# Patient Record
Sex: Female | Born: 1960 | Race: White | Hispanic: No | Smoking: Never smoker
Health system: Southern US, Community
[De-identification: ages and names within clinical notes are randomized; demographics above are authoritative.]

## PROBLEM LIST (undated history)

## (undated) DIAGNOSIS — F32A Depression, unspecified: Secondary | ICD-10-CM

## (undated) DIAGNOSIS — K219 Gastro-esophageal reflux disease without esophagitis: Secondary | ICD-10-CM

## (undated) DIAGNOSIS — E785 Hyperlipidemia, unspecified: Secondary | ICD-10-CM

## (undated) DIAGNOSIS — F329 Major depressive disorder, single episode, unspecified: Secondary | ICD-10-CM

## (undated) HISTORY — PX: LASIK: SHX215

## (undated) HISTORY — PX: TUBAL LIGATION: SHX77

## (undated) HISTORY — DX: Major depressive disorder, single episode, unspecified: F32.9

## (undated) HISTORY — DX: Hyperlipidemia, unspecified: E78.5

## (undated) HISTORY — DX: Depression, unspecified: F32.A

---

## 2003-03-02 HISTORY — PX: AUGMENTATION MAMMAPLASTY: SUR837

## 2004-03-01 HISTORY — PX: STOMACH SURGERY: SHX791

## 2005-03-01 HISTORY — PX: BREAST ENHANCEMENT SURGERY: SHX7

## 2006-06-07 ENCOUNTER — Ambulatory Visit: Payer: Self-pay | Admitting: Gynecologic Oncology

## 2006-06-30 ENCOUNTER — Ambulatory Visit: Payer: Self-pay | Admitting: Gynecologic Oncology

## 2010-09-11 ENCOUNTER — Emergency Department: Payer: Self-pay | Admitting: Emergency Medicine

## 2011-03-02 HISTORY — PX: COLONOSCOPY: SHX174

## 2011-03-02 HISTORY — PX: ENDOMETRIAL ABLATION: SHX621

## 2011-10-06 ENCOUNTER — Encounter: Payer: Self-pay | Admitting: Pulmonary Disease

## 2011-10-07 ENCOUNTER — Ambulatory Visit (INDEPENDENT_AMBULATORY_CARE_PROVIDER_SITE_OTHER): Payer: BC Managed Care – PPO | Admitting: Pulmonary Disease

## 2011-10-07 ENCOUNTER — Encounter: Payer: Self-pay | Admitting: Pulmonary Disease

## 2011-10-07 ENCOUNTER — Ambulatory Visit (INDEPENDENT_AMBULATORY_CARE_PROVIDER_SITE_OTHER)
Admission: RE | Admit: 2011-10-07 | Discharge: 2011-10-07 | Disposition: A | Discharge status: Home / Self Care | Payer: BC Managed Care – PPO | Source: Ambulatory Visit | Attending: Pulmonary Disease | Admitting: Pulmonary Disease

## 2011-10-07 ENCOUNTER — Inpatient Hospital Stay: Payer: Self-pay | Admitting: Internal Medicine

## 2011-10-07 VITALS — BP 112/60 | HR 60 | Temp 97.5°F | Ht 64.0 in | Wt 137.4 lb

## 2011-10-07 DIAGNOSIS — D649 Anemia, unspecified: Secondary | ICD-10-CM

## 2011-10-07 DIAGNOSIS — R0602 Shortness of breath: Secondary | ICD-10-CM

## 2011-10-07 DIAGNOSIS — R0600 Dyspnea, unspecified: Secondary | ICD-10-CM

## 2011-10-07 DIAGNOSIS — R0989 Other specified symptoms and signs involving the circulatory and respiratory systems: Secondary | ICD-10-CM

## 2011-10-07 DIAGNOSIS — R0609 Other forms of dyspnea: Secondary | ICD-10-CM

## 2011-10-07 DIAGNOSIS — D509 Iron deficiency anemia, unspecified: Secondary | ICD-10-CM | POA: Insufficient documentation

## 2011-10-07 DIAGNOSIS — R079 Chest pain, unspecified: Secondary | ICD-10-CM

## 2011-10-07 LAB — COMPREHENSIVE METABOLIC PANEL
Albumin: 4.2 g/dL (ref 3.4–5.0)
Alkaline Phosphatase: 40 U/L — ABNORMAL LOW (ref 50–136)
BUN: 6 mg/dL — ABNORMAL LOW (ref 7–18)
Bilirubin,Total: 0.2 mg/dL (ref 0.2–1.0)
Chloride: 103 mmol/L (ref 98–107)
Co2: 25 mmol/L (ref 21–32)
Creatinine: 0.88 mg/dL (ref 0.60–1.30)
EGFR (Non-African Amer.): >60
Osmolality: 270 (ref 275–301)
Potassium: 3.7 mmol/L (ref 3.5–5.1)
SGPT (ALT): 19 U/L (ref 12–78)
Sodium: 136 mmol/L (ref 136–145)
Total Protein: 7.8 g/dL (ref 6.4–8.2)

## 2011-10-07 LAB — CBC
HCT: 20.6 % — ABNORMAL LOW (ref 35.0–47.0)
HGB: 6.4 g/dL — ABNORMAL LOW (ref 12.0–16.0)
MCH: 18.8 pg — ABNORMAL LOW (ref 26.0–34.0)
MCHC: 30.9 g/dL — ABNORMAL LOW (ref 32.0–36.0)

## 2011-10-07 LAB — TROPONIN I: Troponin-I: <0.02 ng/mL

## 2011-10-07 NOTE — Assessment & Plan Note (Signed)
As noted above Cheryl Goodwin has a microcytic anemia with a hemoglobin of only 6.6. This is concerning because her hemoglobin was 6.6 about a month ago. She has no signs of overt bleeding by history but I think that this is a likely explanation for her shortness of breath.  Plan: -She is due to go to the [facility] emergency room today for a stat repeat CBC and likely admission for transfusion and anemia workup.

## 2011-10-07 NOTE — Patient Instructions (Signed)
We will send you for an exercise stress test with Drs. Gollan and Arida's office We will send you send you for a Chest x-ray at the [facility] office We will request the results of the blood test performed by your primary care physician If all these tests are normal, then you should start exercising regularly and then see [place] back in 3 months

## 2011-10-07 NOTE — Assessment & Plan Note (Addendum)
Today in the office Cheryl Goodwin's simple spirometry and oximetry testing with walking or normal. She also had a normal EKG. These are reassuring that there is no severe underlying lung disease. While spirometry can be normal in the setting of asthma she does not have symptoms are consistent with asthma as she has no cough or wheezing. Her functional status seems to be intact but she is just more short of breath now than she was 2 years ago she was working out on a regular basis. The only abnormalities that noted on exam today is a few crackles in her right lung base and her heart rate climbed a little higher than I would expect for only walking 500 feet.  We obtain records of her labwork performed by her primary care physician's office last month and found that her hemoglobin on 09/10/2011 was 6.6. Unclear to me what the cause of her anemia is but I think that this is likely the etiology of her shortness of breath.  She does have some atypical chest pain which need to be evaluated with an exercise stress test.  Plan: -The patient will go to the [facility] emergency room today for a repeat H&H an admission for anemia workup and transfusion. -She'll have an exercise stress test -After her hospitalization (and if her stress test is normal) I've encouraged her to start exercising on a regular basis. -Followup with me in 3 months

## 2011-10-07 NOTE — Assessment & Plan Note (Signed)
She describes a very atypical chest pain which has a low pretest probability of being due to coronary ischemia. It is reasonable to assess this with an exercise stress test.

## 2011-10-07 NOTE — Progress Notes (Signed)
Subjective:    Patient ID: Cheryl Goodwin, female    DOB: 1960-07-17, 51 y.o.   MRN: [id number]  HPI  This is a very pleasant 51 year old female who comes her office today for evaluation of shortness of breath. She states that she is a nonsmoker but has been around smokers most of her life. For the last year she's has been experiencing increasing shortness of breath on exertion.  She can still be regular activities around the house but gets somewhat short of breath when climbing multiple flights of stairs or running a vacuum cleaner. When she goes to the grocery store she does not feel short of breath walking around. She states that the shortness of breath is worse when she bends over or tries to pick something up. She states that when this occurs she notices that her heart rate goes up. She does not have cough and has not noticed herself wheezing. There are no environmental triggers that will make her suddenly start to feel short of breath.  Since the death of her sister 2 years ago she has been very depressed and she basically has not been exercising at all which is unusual for her. 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  She also notes chest pressure on the right side which typically occurs at rest it is sometimes associated with her shortness of breath. She states that her shortness of breath is also associated with a fast heart rate.  Past Medical History  Diagnosis Date  . Depression   . Hyperlipidemia      Family History  Problem Relation Age of Onset  . Stroke Mother   . Emphysema Father     was a smoker  . COPD Sister     was a smoker     History   Social History  . Marital Status: Married    Spouse Name: N/A    Number of Children: 2  . Years of Education: N/A   Occupational History  . Unemployed    Social History Main Topics  . Smoking status: Never Smoker   . Smokeless tobacco: Never Used  .  Alcohol Use: No  . Drug Use: No  . Sexually Active: Not on file   Other Topics Concern  . Not on file   Social History Narrative  . No narrative on file     No Known Allergies   No outpatient prescriptions prior to visit.     Review of Systems  Constitutional: Negative for fever, chills and unexpected weight change.  HENT: Negative for ear pain, nosebleeds, congestion, sore throat, rhinorrhea, sneezing, trouble swallowing, dental problem, voice change, postnasal drip and sinus pressure.   Eyes: Negative for visual disturbance.  Respiratory: Positive for shortness of breath. Negative for cough and choking.   Cardiovascular: Positive for chest pain. Negative for leg swelling.  Gastrointestinal: Negative for vomiting, abdominal pain and diarrhea.  Genitourinary: Negative for difficulty urinating.  Musculoskeletal: Negative for arthralgias.  Skin: Negative for rash.  Neurological: Positive for headaches. Negative for tremors and syncope.  Hematological: Does not bruise/bleed easily.       Objective:   Physical Exam  Filed Vitals:   10/07/11 1415  BP: 112/60  Pulse: 60  Temp: 97.5 F (36.4 C)  TempSrc: Oral  Height: 5\' 4"  (1.626 m)  Weight: 137 lb 6.4 oz (62.324 kg)  SpO2: 100%   Walked 56ft in office,  95% was lowest O2 saturation, HR to 113  Gen: well appearing, no acute distress HEENT: NCAT, PERRL, EOMi, OP clear, neck supple without masses PULM: Insp crackles R base CV: RRR, no mgr, no JVD AB: BS+, soft, nontender, no hsm Ext: warm, no edema, no clubbing, no cyanosis Derm: no rash or skin breakdown Neuro: A&Ox4, CN II-XII intact, strength 5/5 in all 4 extremities  10/07/2011 simple spirometry normal 10/07/2011 chest x-ray hyperinflation no infiltrate     Assessment & Plan:   Shortness of breath Today in the office Ms. Cheryl Goodwin's simple spirometry and oximetry testing with walking or normal. She also had a normal EKG. These are reassuring that there is no  severe underlying lung disease. While spirometry can be normal in the setting of asthma she does not have symptoms are consistent with asthma as she has no cough or wheezing. Her functional status seems to be intact but she is just more short of breath now than she was 2 years ago she was working out on a regular basis. The only abnormalities that noted on exam today is a few crackles in her right lung base and her heart rate climbed a little higher than I would expect for only walking 500 feet.  We obtain records of her labwork performed by her primary care physician's office last month and found that her hemoglobin on 09/10/2011 was 6.6. Unclear to me what the cause of her anemia is but I think that this is likely the etiology of her shortness of breath.  She does have some atypical chest pain which need to be evaluated with an exercise stress test.  Plan: -The patient will go to the [facility] emergency room today for a repeat H&H an admission for anemia workup and transfusion. -She'll have an exercise stress test -After her hospitalization (and if her stress test is normal) I've encouraged her to start exercising on a regular basis. -Followup with me in 3 months   Chest pain She describes a very atypical chest pain which has a low pretest probability of being due to coronary ischemia. It is reasonable to assess this with an exercise stress test.  Anemia As noted above Ms. Cheryl Goodwin has a microcytic anemia with a hemoglobin of only 6.6. This is concerning because her hemoglobin was 6.6 about a month ago. She has no signs of overt bleeding by history but I think that this is a likely explanation for her shortness of breath.  Plan: -She is due to go to the [facility] emergency room today for a stat repeat CBC and likely admission for transfusion and anemia workup.   Updated Medication List Outpatient Encounter Prescriptions as of 10/07/2011  Medication Sig Dispense Refill  . buPROPion (WELLBUTRIN XL) 300 MG  24 hr tablet Take 300 mg by mouth daily.      Marland Kitchen omeprazole (PRILOSEC) 40 MG capsule Take 40 mg by mouth daily.      . sertraline (ZOLOFT) 100 MG tablet Take 150 mg by mouth daily.      . simvastatin (ZOCOR) 20 MG tablet Take 20 mg by mouth every evening.      . Vitamin D, Ergocalciferol, (DRISDOL) 50000 UNITS CAPS Take 50,000 Units by mouth every 7 (seven) days.

## 2011-10-08 LAB — CBC WITH DIFFERENTIAL/PLATELET
Basophil #: 0.2 10*3/uL — ABNORMAL HIGH (ref 0.0–0.1)
Basophil %: 2.2 %
Eosinophil #: 0.4 10*3/uL (ref 0.0–0.7)
HCT: 23 % — ABNORMAL LOW (ref 35.0–47.0)
HGB: 7.5 g/dL — ABNORMAL LOW (ref 12.0–16.0)
Lymphocyte #: 2.9 10*3/uL (ref 1.0–3.6)
MCH: 20.8 pg — ABNORMAL LOW (ref 26.0–34.0)
MCHC: 32.5 g/dL (ref 32.0–36.0)
Monocyte #: 0.7 x10 3/mm (ref 0.2–0.9)
Neutrophil #: 4.3 10*3/uL (ref 1.4–6.5)
RDW: 23.4 % — ABNORMAL HIGH (ref 11.5–14.5)

## 2011-10-08 LAB — MAGNESIUM: Magnesium: 2 mg/dL

## 2011-10-09 LAB — CBC WITH DIFFERENTIAL/PLATELET
Basophil #: 0.1 10*3/uL (ref 0.0–0.1)
Lymphocyte #: 2.6 10*3/uL (ref 1.0–3.6)
MCH: 21.4 pg — ABNORMAL LOW (ref 26.0–34.0)
MCHC: 32.9 g/dL (ref 32.0–36.0)
MCV: 65 fL — ABNORMAL LOW (ref 80–100)
Monocyte #: 0.7 x10 3/mm (ref 0.2–0.9)
Neutrophil #: 5 10*3/uL (ref 1.4–6.5)
Platelet: 484 10*3/uL — ABNORMAL HIGH (ref 150–440)
RDW: 25.2 % — ABNORMAL HIGH (ref 11.5–14.5)

## 2011-10-14 ENCOUNTER — Encounter: Payer: BC Managed Care – PPO | Admitting: Physician Assistant

## 2011-11-02 ENCOUNTER — Observation Stay: Payer: Self-pay | Admitting: Obstetrics and Gynecology

## 2012-02-15 ENCOUNTER — Ambulatory Visit: Payer: BC Managed Care – PPO | Admitting: Pulmonary Disease

## 2012-02-15 NOTE — Progress Notes (Signed)
No show

## 2014-04-19 DIAGNOSIS — B001 Herpesviral vesicular dermatitis: Secondary | ICD-10-CM | POA: Insufficient documentation

## 2014-04-19 DIAGNOSIS — F419 Anxiety disorder, unspecified: Secondary | ICD-10-CM | POA: Insufficient documentation

## 2014-06-18 NOTE — Discharge Summary (Signed)
PATIENT NAMEDOROTHE, Cheryl Goodwin MR#:  [id number] DATE OF BIRTH:  06-20-1960  DATE OF ADMISSION:  10/07/2011 DATE OF DISCHARGE:  10/09/2011  DISCHARGE DIAGNOSES:  1. Symptomatic anemia of iron deficiency and blood loss due to menorrhagia. 2. Gastroesophageal reflux disease. 3. Hyperlipidemia.   4. Anxiety.   DISPOSITION: The patient is being discharged home.   DIET: Regular.   ACTIVITY: As tolerated.   DISCHARGE MEDICATIONS:  1. Iron polysaccharide 150 mg b.i.d.  2. Vitamin D2 50,000 international units once a week.  3. Bupropion 300 mg one tablet q. 24 hours. 4. Omeprazole 40 mg daily.  5. Simvastatin 20 mg daily.  6. Sertraline 100 mg daily.   FOLLOW-UP:  1. Follow-up with Dr. Ellison Hughs, primary care physician. 2. Follow-up with OB/GYN doctor, Dr. Amalia Hailey, in 1 to 2 weeks after discharge.   RESULTS: Pelvic ultrasound showed no acute abnormalities other than small ovarian cyst.   Chest x-ray no acute abnormalities.   Hemoglobin was 6.4 on admission, 9.4 by the time of discharge. Platelet count 513 to 484. Glucose 111. Rest of CMP was normal. Cardiac enzymes negative.   HOSPITAL COURSE: The patient is a 54 year old female with past medical history of anxiety, depression, hyperlipidemia, and gastroesophageal reflux disease who presented with shortness of breath. She was found to have anemia with hemoglobin of 6.4. She had menorrhagia for almost one and a half years but had not sought evaluation for that. She received two units of blood during the hospitalization and her hemoglobin went up to 9.1. Symptomatically she was much improved. A pelvic ultrasound was done which showed no acute abnormalities. The patient was offered a GYN evaluation during the hospitalization, however, she wanted to follow-up with her OB/GYN doctor, Dr. Amalia Hailey, as an outpatient. She is being discharged home on oral iron and advised to follow-up with Dr. Amalia Hailey, her OB/GYN, within a week of discharge.   TIME SPENT:  45 minutes.   ____________________________ [name], MD sp:drc D: 10/09/2011 14:39:31 ET T: 10/11/2011 11:02:13 ET JOB#: [id number]  cc: [name], MD, <Dictator>  [name] MD ELECTRONICALLY SIGNED 10/11/2011 13:16

## 2014-06-18 NOTE — H&P (Signed)
PATIENT NAME:  Cheryl Goodwin, Cheryl Goodwin MR#:  [id number] DATE OF BIRTH:  [birth year]  DATE OF ADMISSION:  10/07/2011  PRIMARY CARE PHYSICIAN: No. Will have appointment with [facility] nurse practitioner  REFERRING PHYSICIAN: Dr. Reita Cliche  CHIEF COMPLAINT: Shortness of breath, weakness for three months.   HISTORY OF PRESENT ILLNESS: 54 year old Caucasian female with history of anemia, gastroesophageal reflux disease, hyperlipidemia, anxiety presented to the ED with above chief complaint. Patient is alert, awake, oriented in no acute distress. Per patient she has had intermittent shortness of breath, weakness, occasional dizziness for the past three months. 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 She denies any hematuria, melena or bloody stool but has irregular periods. She saw pulmonary physician due to shortness of breath today. Was noted to have hemoglobin of 6 and was sent to the ED for further evaluation. In ED her hemoglobin is 6.4. Stool occult is negative. Dr. Reita Cliche ordered PRBC transfusion and is admitted for anemia.  PAST MEDICAL HISTORY:  1. Anemia.  2. Gastroesophageal reflux disease.  3. Hyperlipidemia.  4. Anxiety.   PAST SURGICAL HISTORY: No.   SOCIAL HISTORY: No smoking, alcohol drinking, or illicit drugs.   FAMILY HISTORY: Chronic obstructive pulmonary disease runs in her family.   MEDICATIONS:  1. Vitamin D2 50,000 international units 1 cap once week. 2. Zocor 20 mg p.o. daily. 3. Sertraline 100 mg p.o. once daily.  4. Omeprazole 40 mg p.o. daily.  5. Bupropion 300 mg p.o. every 24 hours a day.   ALLERGIES: No.   REVIEW OF SYSTEMS: CONSTITUTIONAL: Patient denies any fever, chills. No headache but has occasional dizziness and also has generalized weakness. EYES: No double vision or blurred vision. ENT: No epistaxis, postnasal drip, slurred speech, or dysphagia. CARDIOVASCULAR: Has chest tightness but no chest pain,  palpitations, orthopnea, or nocturnal dyspnea. No leg edema. PULMONARY: Positive for shortness of breath but no cough, wheezing, hemoptysis or sputum. GASTROINTESTINAL: No abdominal pain, nausea, vomiting, or diarrhea. No melena or bloody stool. GENITOURINARY: No dysuria, hematuria, or incontinence. SKIN: No rash or jaundice. ENDOCRINE: No polyuria, polydipsia. HEMATOLOGY: No easy bruising. No bleeding. PSYCH: No anxiety or depression. NEUROLOGY: No syncope, loss of consciousness or seizure.   PHYSICAL EXAMINATION: VITAL SIGNS: Blood pressure 131/76, pulse 82, respirations 18, oxygen saturation 100% on room air.   GENERAL: Patient is alert, awake, oriented in no acute distress.   HEENT: Pupils round, equal, reactive to light and accommodation. Moist oral mucosa. Clear oropharynx.   NECK: Supple. No JVD or carotid bruit. No lymphadenopathy. No thyromegaly.   CARDIOVASCULAR: S1, S2 regular rate, rhythm. No murmurs, gallops.   PULMONARY: Bilateral air entry. No wheezing or rales.   ABDOMEN: Soft. No distention or tenderness. No organomegaly. Bowel sounds present.   EXTREMITIES: No edema, clubbing, or cyanosis. No calf tenderness. Strong bilateral pedal pulses.   SKIN: No rash or jaundice but has pale skin. No bruises.   NEUROLOGY: Alert and oriented x3. No focal deficit. Power 5/5. Sensation intact. Deep tendon reflexes 2+.   LABORATORY, DIAGNOSTIC AND RADIOLOGICAL DATA: WBC 8.9, hemoglobin 6.4, platelets 513, glucose 111, BUN 6, creatinine 0.88. Electrolytes normal. INR 1.0, PT 13.5. Troponin 0.02. EKG showed normal sinus rhythm at 84 beats per minute.   IMPRESSION:  1. Symptomatic anemia.  2. History of gastroesophageal reflux disease.  3. Hyperlipidemia.  4. Anxiety.   PLAN OF TREATMENT: The patient will be admitted to medical floor. We will follow  up PRBC transfusion and follow-up CBC. We will continue home medications. GI and deep vein thrombosis prophylaxis with TED.  Discussed  the patient's situation and plan of treatment with patient and patient's husband.   TIME SPENT: About 50 minutes.   ____________________________ Demetrios Loll, MD qc:cms D: 10/07/2011 20:14:40 ET T: 10/08/2011 06:59:42 ET JOB#: [id number]  cc: Demetrios Loll, MD, <Dictator> Demetrios Loll MD ELECTRONICALLY SIGNED 10/08/2011 16:52

## 2014-09-17 DIAGNOSIS — W19XXXA Unspecified fall, initial encounter: Secondary | ICD-10-CM | POA: Insufficient documentation

## 2014-09-18 DIAGNOSIS — S32009A Unspecified fracture of unspecified lumbar vertebra, initial encounter for closed fracture: Secondary | ICD-10-CM | POA: Insufficient documentation

## 2014-09-24 HISTORY — PX: BACK SURGERY: SHX140

## 2014-09-27 DIAGNOSIS — E785 Hyperlipidemia, unspecified: Secondary | ICD-10-CM | POA: Insufficient documentation

## 2014-09-27 DIAGNOSIS — K5900 Constipation, unspecified: Secondary | ICD-10-CM | POA: Insufficient documentation

## 2014-09-27 DIAGNOSIS — K219 Gastro-esophageal reflux disease without esophagitis: Secondary | ICD-10-CM | POA: Insufficient documentation

## 2014-09-29 DIAGNOSIS — F32A Depression, unspecified: Secondary | ICD-10-CM | POA: Insufficient documentation

## 2016-07-01 ENCOUNTER — Encounter (INDEPENDENT_AMBULATORY_CARE_PROVIDER_SITE_OTHER): Payer: Self-pay

## 2016-07-01 ENCOUNTER — Other Ambulatory Visit: Payer: Self-pay

## 2016-07-01 ENCOUNTER — Ambulatory Visit (INDEPENDENT_AMBULATORY_CARE_PROVIDER_SITE_OTHER): Payer: Self-pay | Admitting: Gastroenterology

## 2016-07-01 VITALS — BP 120/79 | HR 71 | Temp 98.6°F | Ht 64.0 in | Wt 149.8 lb

## 2016-07-01 DIAGNOSIS — R1013 Epigastric pain: Secondary | ICD-10-CM

## 2016-07-01 DIAGNOSIS — K219 Gastro-esophageal reflux disease without esophagitis: Secondary | ICD-10-CM

## 2016-07-01 NOTE — Progress Notes (Signed)
Gastroenterology Consultation  Referring Provider:     Juanito Doom, MD Primary Care Physician:  [facility], Chrissie Noa, MD Primary Gastroenterologist:  Dr. Allen Norris     Reason for Consultation:     Abdominal burning        HPI:   Cheryl Goodwin is a 56 y.o. y/o female referred for consultation & management of abdominal burning by Dr. Ellison Hughs, Chrissie Noa, MD.  This woman comes today for a report of abdominal burning. She states that the burning is in the epigastric area. The patient states that the burning has been present for the last month or so. She also takes anti-inflammatory medication that she states is for her hip pain. The patient is concerned that she may have stomach cancer cause her husband died last year stomach cancer with bony metastases after having very few symptoms prior to that. The patient denies any dysphagia or black stools or bloody stools. The patient also denies any early satiety or weight loss. She does report having a colonoscopy in 2013 that she reported was normal and that she would not need another colonoscopy for 10 years.  Past Medical History:  Diagnosis Date  . Depression   . Hyperlipidemia     Past Surgical History:  Procedure Laterality Date  . BREAST ENHANCEMENT SURGERY  2007  . STOMACH SURGERY  2006   "Tummy Tuck"  . TUBAL LIGATION      Prior to Admission medications   Medication Sig Start Date End Date Taking? Authorizing Provider  buPROPion (WELLBUTRIN XL) 300 MG 24 hr tablet Take 300 mg by mouth daily.   Yes Historical Provider, MD  omeprazole (PRILOSEC) 40 MG capsule Take 40 mg by mouth daily.   Yes Historical Provider, MD  sertraline (ZOLOFT) 100 MG tablet Take 150 mg by mouth daily.   Yes Historical Provider, MD  simvastatin (ZOCOR) 20 MG tablet Take 20 mg by mouth every evening.   Yes Historical Provider, MD  Vitamin D, Ergocalciferol, (DRISDOL) 50000 UNITS CAPS Take 50,000 Units by mouth every 7 (seven) days.    Historical Provider, MD     Family History  Problem Relation Age of Onset  . Stroke Mother   . Emphysema Father     was a smoker  . COPD Sister     was a smoker     Social History  Substance Use Topics  . Smoking status: Never Smoker  . Smokeless tobacco: Never Used  . Alcohol use No    Allergies as of 07/01/2016  . (No Known Allergies)    Review of Systems:    All systems reviewed and negative except where noted in HPI.   Physical Exam:  BP 120/79   Pulse 71   Temp 98.6 F (37 C) (Oral)   Ht 5\' 4"  (1.626 m)   Wt 149 lb 12 oz (67.9 kg)   BMI 25.70 kg/m  No LMP recorded. Psych:  Alert and cooperative. Normal mood and affect. General:   Alert,  Well-developed, well-nourished, pleasant and cooperative in NAD Head:  Normocephalic and atraumatic. Eyes:  Sclera clear, no icterus.   Conjunctiva pink. Ears:  Normal auditory acuity. Nose:  No deformity, discharge, or lesions. Mouth:  No deformity or lesions,oropharynx pink & moist. Neck:  Supple; no masses or thyromegaly. Lungs:  Respirations even and unlabored.  Clear throughout to auscultation.   No wheezes, crackles, or rhonchi. No acute distress. Heart:  Regular rate and rhythm; no murmurs, clicks, rubs, or gallops. Abdomen:  Normal bowel sounds.  No bruits.  Soft, non-tender and non-distended without masses, hepatosplenomegaly or hernias noted.  No guarding or rebound tenderness.  Negative Carnett sign.   Rectal:  Deferred.  Msk:  Symmetrical without gross deformities.  Good, equal movement & strength bilaterally. Pulses:  Normal pulses noted. Extremities:  No clubbing or edema.  No cyanosis. Neurologic:  Alert and oriented x3;  grossly normal neurologically. Skin:  Intact without significant lesions or rashes.  No jaundice. Lymph Nodes:  No significant cervical adenopathy. Psych:  Alert and cooperative. Normal mood and affect.  Imaging Studies: No results found.  Assessment and Plan:   Cheryl Goodwin is a 56 y.o. y/o female who comes  in with abdominal burning. The patient does not have any reproducible pain on palpation. The patient is concerned that her burning may be stomach cancer since her husband died of stomach cancer. The patient will be set up for an EGD and was started on a trial of Dexilant. I have discussed risks & benefits which include, but are not limited to, bleeding, infection, perforation & drug reaction.  The patient agrees with this plan & written consent will be obtained.       Lucilla Lame, MD. Marval Regal   Note: This dictation was prepared with Dragon dictation along with smaller phrase technology. Any transcriptional errors that result from this process are unintentional.

## 2016-07-02 ENCOUNTER — Telehealth: Payer: Self-pay | Admitting: Gastroenterology

## 2016-07-02 NOTE — Telephone Encounter (Signed)
07/02/16 Patient is self pay

## 2016-07-05 ENCOUNTER — Encounter: Payer: Self-pay | Admitting: *Deleted

## 2016-07-08 ENCOUNTER — Encounter
Admission: RE | Disposition: A | Discharge status: Home / Self Care | Payer: Self-pay | Source: Ambulatory Visit | Attending: Gastroenterology

## 2016-07-08 ENCOUNTER — Ambulatory Visit
Admission: RE | Admit: 2016-07-08 | Discharge: 2016-07-08 | Disposition: A | Discharge status: Home / Self Care | Payer: Self-pay | Source: Ambulatory Visit | Attending: Gastroenterology | Admitting: Gastroenterology

## 2016-07-08 ENCOUNTER — Ambulatory Visit: Payer: Self-pay | Admitting: Anesthesiology

## 2016-07-08 DIAGNOSIS — E785 Hyperlipidemia, unspecified: Secondary | ICD-10-CM | POA: Insufficient documentation

## 2016-07-08 DIAGNOSIS — F329 Major depressive disorder, single episode, unspecified: Secondary | ICD-10-CM | POA: Insufficient documentation

## 2016-07-08 DIAGNOSIS — Z79899 Other long term (current) drug therapy: Secondary | ICD-10-CM | POA: Insufficient documentation

## 2016-07-08 DIAGNOSIS — K449 Diaphragmatic hernia without obstruction or gangrene: Secondary | ICD-10-CM | POA: Insufficient documentation

## 2016-07-08 DIAGNOSIS — K219 Gastro-esophageal reflux disease without esophagitis: Secondary | ICD-10-CM | POA: Insufficient documentation

## 2016-07-08 DIAGNOSIS — R1013 Epigastric pain: Secondary | ICD-10-CM

## 2016-07-08 HISTORY — PX: ESOPHAGOGASTRODUODENOSCOPY (EGD) WITH PROPOFOL: SHX5813

## 2016-07-08 HISTORY — DX: Gastro-esophageal reflux disease without esophagitis: K21.9

## 2016-07-08 SURGERY — ESOPHAGOGASTRODUODENOSCOPY (EGD) WITH PROPOFOL
Anesthesia: Monitor Anesthesia Care | Wound class: Clean Contaminated

## 2016-07-08 MED ORDER — LACTATED RINGERS IV SOLN
10.0000 mL/h | INTRAVENOUS | Status: DC
  Administered 2016-07-08: 10 mL/h via INTRAVENOUS

## 2016-07-08 MED ORDER — PROPOFOL 10 MG/ML IV BOLUS
INTRAVENOUS | Status: DC | PRN
  Administered 2016-07-08: 20 mg via INTRAVENOUS
  Administered 2016-07-08: 80 mg via INTRAVENOUS

## 2016-07-08 MED ORDER — LIDOCAINE HCL (CARDIAC) 20 MG/ML IV SOLN
INTRAVENOUS | Status: DC | PRN
  Administered 2016-07-08: 50 mg via INTRAVENOUS

## 2016-07-08 MED ORDER — GLYCOPYRROLATE 0.2 MG/ML IJ SOLN
INTRAMUSCULAR | Status: DC | PRN
  Administered 2016-07-08: 0.2 mg via INTRAVENOUS

## 2016-07-08 MED ORDER — DEXLANSOPRAZOLE 60 MG PO CPDR: 60.0000 mg | DELAYED_RELEASE_CAPSULE | Freq: Every day | ORAL | 11 refills | Status: DC

## 2016-07-08 SURGICAL SUPPLY — 32 items
BALLN DILATOR 10-12 8 (BALLOONS)
BALLN DILATOR 12-15 8 (BALLOONS)
BALLN DILATOR 15-18 8 (BALLOONS)
BALLN DILATOR CRE 0-12 8 (BALLOONS)
BALLN DILATOR ESOPH 8 10 CRE (MISCELLANEOUS) IMPLANT
BALLOON DILATOR 12-15 8 (BALLOONS) IMPLANT
BALLOON DILATOR 15-18 8 (BALLOONS) IMPLANT
BALLOON DILATOR CRE 0-12 8 (BALLOONS) IMPLANT
BLOCK BITE 60FR ADLT L/F GRN (MISCELLANEOUS) ×3 IMPLANT
CANISTER SUCT 1200ML W/VALVE (MISCELLANEOUS) ×3 IMPLANT
CLIP HMST 235XBRD CATH ROT (MISCELLANEOUS) IMPLANT
CLIP RESOLUTION 360 11X235 (MISCELLANEOUS)
FCP ESCP3.2XJMB 240X2.8X (MISCELLANEOUS)
FORCEPS BIOP RAD 4 LRG CAP 4 (CUTTING FORCEPS) IMPLANT
FORCEPS BIOP RJ4 240 W/NDL (MISCELLANEOUS)
FORCEPS ESCP3.2XJMB 240X2.8X (MISCELLANEOUS) IMPLANT
GOWN CVR UNV OPN BCK APRN NK (MISCELLANEOUS) ×2 IMPLANT
GOWN ISOL THUMB LOOP REG UNIV (MISCELLANEOUS) ×4
INJECTOR VARIJECT VIN23 (MISCELLANEOUS) IMPLANT
KIT DEFENDO VALVE AND CONN (KITS) IMPLANT
KIT ENDO PROCEDURE OLY (KITS) ×3 IMPLANT
MARKER SPOT ENDO TATTOO 5ML (MISCELLANEOUS) IMPLANT
PAD GROUND ADULT SPLIT (MISCELLANEOUS) IMPLANT
RETRIEVER NET PLAT FOOD (MISCELLANEOUS) IMPLANT
SNARE SHORT THROW 13M SML OVAL (MISCELLANEOUS) IMPLANT
SNARE SHORT THROW 30M LRG OVAL (MISCELLANEOUS) IMPLANT
SPOT EX ENDOSCOPIC TATTOO (MISCELLANEOUS)
SYR INFLATION 60ML (SYRINGE) IMPLANT
TRAP ETRAP POLY (MISCELLANEOUS) IMPLANT
VARIJECT INJECTOR VIN23 (MISCELLANEOUS)
WATER STERILE IRR 250ML POUR (IV SOLUTION) ×3 IMPLANT
WIRE CRE 18-20MM 8CM F G (MISCELLANEOUS) IMPLANT

## 2016-07-08 NOTE — Anesthesia Postprocedure Evaluation (Signed)
Anesthesia Post Note  Patient: Cheryl Goodwin  Procedure(s) Performed: Procedure(s) (LRB): ESOPHAGOGASTRODUODENOSCOPY (EGD) WITH PROPOFOL (N/A)  Patient location during evaluation: PACU Anesthesia Type: MAC Level of consciousness: awake and alert Pain management: pain level controlled Vital Signs Assessment: post-procedure vital signs reviewed and stable Respiratory status: spontaneous breathing, nonlabored ventilation and respiratory function stable Cardiovascular status: stable Postop Assessment: no signs of nausea or vomiting Anesthetic complications: no    Veda Canning

## 2016-07-08 NOTE — Op Note (Signed)
[facility] Gastroenterology Patient Name: Cheryl Goodwin Procedure Date: 07/08/2016 8:07 AM MRN: [id number] Account #: [id number] Date of Birth: [birth year] Admit Type: Outpatient Age: 56 Room: [facility] OR ROOM 01 Gender: Female Note Status: Finalized Procedure:            Upper GI endoscopy Indications:          Epigastric abdominal pain Providers:            Lucilla Lame MD, MD Referring MD:         Sofie Hartigan (Referring MD) Medicines:            Propofol per Anesthesia Complications:        No immediate complications. Procedure:            Pre-Anesthesia Assessment:                       - Prior to the procedure, a History and Physical was                        performed, and patient medications and allergies were                        reviewed. The patient's tolerance of previous                        anesthesia was also reviewed. The risks and benefits of                        the procedure and the sedation options and risks were                        discussed with the patient. All questions were                        answered, and informed consent was obtained. Prior                        Anticoagulants: The patient has taken no previous                        anticoagulant or antiplatelet agents. ASA Grade                        Assessment: II - A patient with mild systemic disease.                        After reviewing the risks and benefits, the patient was                        deemed in satisfactory condition to undergo the                        procedure.                       After obtaining informed consent, the endoscope was                        passed under direct vision. Throughout the procedure,  the patient's blood pressure, pulse, and oxygen                        saturations were monitored continuously. The Olympus                        [id number]-[id number] Endoscope (S#. [id number]) was introduced      through the mouth, and advanced to the second part of                        duodenum. The upper GI endoscopy was accomplished                        without difficulty. The patient tolerated the procedure                        well. Findings:      A small hiatal hernia was present.      The stomach was normal.      The examined duodenum was normal. Impression:           - Small hiatal hernia.                       - Normal stomach.                       - Normal examined duodenum.                       - No specimens collected. Recommendation:       - Discharge patient to home.                       - Resume previous diet.                       - Continue present medications. Procedure Code(s):    --- Professional ---                       [phone], Esophagogastroduodenoscopy, flexible, transoral;                        diagnostic, including collection of specimen(s) by                        brushing or washing, when performed (separate procedure) Diagnosis Code(s):    --- Professional ---                       R10.13, Epigastric pain CPT copyright 2016 American Medical Association. All rights reserved. The codes documented in this report are preliminary and upon coder review may  be revised to meet current compliance requirements. Lucilla Lame MD, MD 07/08/2016 9:11:12 AM This report has been signed electronically. Number of Addenda: 0 Note Initiated On: 07/08/2016 8:07 AM      [facility]

## 2016-07-08 NOTE — Anesthesia Procedure Notes (Signed)
Procedure Name: MAC Performed by: Mayme Genta Pre-anesthesia Checklist: Patient identified, Emergency Drugs available, Suction available, Timeout performed and Patient being monitored Patient Re-evaluated:Patient Re-evaluated prior to inductionOxygen Delivery Method: Nasal cannula Placement Confirmation: positive ETCO2

## 2016-07-08 NOTE — Transfer of Care (Signed)
Immediate Anesthesia Transfer of Care Note  Patient: Cheryl Goodwin  Procedure(s) Performed: Procedure(s): ESOPHAGOGASTRODUODENOSCOPY (EGD) WITH PROPOFOL (N/A)  Patient Location: PACU  Anesthesia Type: MAC  Level of Consciousness: awake, alert  and patient cooperative  Airway and Oxygen Therapy: Patient Spontanous Breathing and Patient connected to supplemental oxygen  Post-op Assessment: Post-op Vital signs reviewed, Patient's Cardiovascular Status Stable, Respiratory Function Stable, Patent Airway and No signs of Nausea or vomiting  Post-op Vital Signs: Reviewed and stable  Complications: No apparent anesthesia complications

## 2016-07-08 NOTE — Anesthesia Preprocedure Evaluation (Signed)
Anesthesia Evaluation  Patient identified by MRN, date of birth, ID band Patient awake    Reviewed: Allergy & Precautions, NPO status   Airway Mallampati: I  TM Distance: >3 FB     Dental  (+) Teeth Intact   Pulmonary    breath sounds clear to auscultation       Cardiovascular  Rhythm:regular Rate:Normal     Neuro/Psych PSYCHIATRIC DISORDERS (depression)    GI/Hepatic GERD  Poorly Controlled,  Endo/Other    Renal/GU      Musculoskeletal   Abdominal   Peds  Hematology   Anesthesia Other Findings   Reproductive/Obstetrics                            Anesthesia Physical Anesthesia Plan  ASA: II  Anesthesia Plan: MAC   Post-op Pain Management:    Induction:   Airway Management Planned:   Additional Equipment:   Intra-op Plan:   Post-operative Plan:   Informed Consent: I have reviewed the patients History and Physical, chart, labs and discussed the procedure including the risks, benefits and alternatives for the proposed anesthesia with the patient or authorized representative who has indicated his/her understanding and acceptance.     Plan Discussed with: CRNA  Anesthesia Plan Comments:         Anesthesia Quick Evaluation

## 2016-07-08 NOTE — H&P (Signed)
   Lucilla Lame, MD [address] [place], [place] [address] [phone] Fax : [phone]  Primary Care Physician:  Sofie Hartigan, MD Primary Gastroenterologist:  Dr. Allen Norris  Pre-Procedure History & Physical: HPI:  Cheryl Goodwin is a 56 y.o. female is here for an endoscopy.   Past Medical History:  Diagnosis Date  . Depression   . GERD (gastroesophageal reflux disease)   . Hyperlipidemia     Past Surgical History:  Procedure Laterality Date  . BACK SURGERY  09/24/2014   L1 fracture, [facility]  . BREAST ENHANCEMENT SURGERY  2007  . COLONOSCOPY  2013  . ENDOMETRIAL ABLATION  2013  . STOMACH SURGERY  2006   "Tummy Tuck"  . TUBAL LIGATION      Prior to Admission medications   Medication Sig Start Date End Date Taking? Authorizing Provider  buPROPion (WELLBUTRIN XL) 300 MG 24 hr tablet Take 300 mg by mouth daily.   Yes [provider]  Dexlansoprazole (DEXILANT PO) Take by mouth daily.   Yes [provider]  sertraline (ZOLOFT) 100 MG tablet Take 150 mg by mouth daily.   Yes [provider]  simvastatin (ZOCOR) 20 MG tablet Take 20 mg by mouth every evening.   Yes [provider]  omeprazole (PRILOSEC) 40 MG capsule Take 40 mg by mouth daily.    [provider]    Allergies as of 07/01/2016  . (No Known Allergies)    Family History  Problem Relation Age of Onset  . Stroke Mother   . Emphysema Father        was a smoker  . COPD Sister        was a smoker    Social History   Social History  . Marital status: Married    Spouse name: N/A  . Number of children: 2  . Years of education: N/A   Occupational History  . Unemployed    Social History Main Topics  . Smoking status: Never Smoker  . Smokeless tobacco: Never Used  . Alcohol use Yes     Comment: 10 beers/month  . Drug use: No  . Sexual activity: Not on file   Other Topics Concern  . Not on file   Social History Narrative  . No  narrative on file    Review of Systems: See HPI, otherwise negative ROS  Physical Exam: BP (!) 103/59   Pulse 71   Temp 97.3 F (36.3 C) (Tympanic)   Resp 16   Ht 5\' 4"  (1.626 m)   Wt 147 lb (66.7 kg)   SpO2 100%   BMI 25.23 kg/m  General:   Alert,  pleasant and cooperative in NAD Head:  Normocephalic and atraumatic. Neck:  Supple; no masses or thyromegaly. Lungs:  Clear throughout to auscultation.    Heart:  Regular rate and rhythm. Abdomen:  Soft, nontender and nondistended. Normal bowel sounds, without guarding, and without rebound.   Neurologic:  Alert and  oriented x4;  grossly normal neurologically.  Impression/Plan: Cheryl Goodwin is here for an endoscopy to be performed for epigastric pain  Risks, benefits, limitations, and alternatives regarding  endoscopy have been reviewed with the patient.  Questions have been answered.  All parties agreeable.   Lucilla Lame, MD  07/08/2016, 8:17 AM

## 2016-07-09 ENCOUNTER — Encounter: Payer: Self-pay | Admitting: Gastroenterology

## 2016-07-15 ENCOUNTER — Encounter: Payer: Self-pay | Admitting: Gastroenterology

## 2016-07-15 ENCOUNTER — Telehealth: Payer: Self-pay | Admitting: Gastroenterology

## 2016-07-15 NOTE — Telephone Encounter (Signed)
Patient's boyfriend Shanon Brow came by and Dorrie is on her 3rd day of diarreah. They think its the dexlansoprazole (DEXILANT) 60 MG capsule. What else can she do? The medication was very expensive.

## 2016-07-15 NOTE — Telephone Encounter (Signed)
Tell the patient to stop taking the Dexilant and see if her abdominal pain is any worse.  If it is then she should be started on Protonix 40 mg a day

## 2016-07-16 ENCOUNTER — Telehealth: Payer: Self-pay

## 2016-07-16 NOTE — Telephone Encounter (Signed)
Advised patient of response from Dr. Allen Norris.  Tell the patient to stop taking the Dexilant and see if her abdominal pain is any worse.  If it is then she should be started on Protonix 40 mg a day

## 2016-07-16 NOTE — Telephone Encounter (Signed)
Error

## 2016-07-19 ENCOUNTER — Other Ambulatory Visit: Payer: Self-pay

## 2016-07-19 DIAGNOSIS — K219 Gastro-esophageal reflux disease without esophagitis: Secondary | ICD-10-CM

## 2016-07-19 MED ORDER — OMEPRAZOLE 40 MG PO CPDR: 40.0000 mg | DELAYED_RELEASE_CAPSULE | Freq: Every day | ORAL | 3 refills | Status: DC

## 2016-10-20 ENCOUNTER — Other Ambulatory Visit: Payer: Self-pay

## 2016-10-20 ENCOUNTER — Encounter (INDEPENDENT_AMBULATORY_CARE_PROVIDER_SITE_OTHER): Payer: Self-pay

## 2016-10-20 ENCOUNTER — Ambulatory Visit
Admission: RE | Admit: 2016-10-20 | Discharge: 2016-10-20 | Disposition: A | Discharge status: Home / Self Care | Payer: Self-pay | Source: Ambulatory Visit | Attending: Oncology | Admitting: Oncology

## 2016-10-20 ENCOUNTER — Ambulatory Visit: Payer: Self-pay | Attending: Oncology

## 2016-10-20 VITALS — BP 116/74 | HR 58 | Temp 97.4°F | Resp 18 | Ht 62.0 in | Wt 151.0 lb

## 2016-10-20 DIAGNOSIS — Z0000 Encounter for general adult medical examination without abnormal findings: Secondary | ICD-10-CM

## 2016-10-20 NOTE — Progress Notes (Signed)
Subjective:     Patient ID: Cheryl Goodwin, female   DOB: November 27, 1960, 56 y.o.   MRN: [id number]  HPI   Review of Systems     Objective:   Physical Exam  Pulmonary/Chest: Right breast exhibits no inverted nipple, no mass, no nipple discharge, no skin change and no tenderness. Left breast exhibits no inverted nipple, no mass, no nipple discharge, no skin change and no tenderness. Breasts are symmetrical.         Assessment:  56 year old patient presents for [facility] clinic visit.  Patient screened, and meets BCCCP eligibility.  Patient does not have insurance, Medicare or Medicaid.  Handout given on Affordable Care Act. Instructed patient on breast self-exam using teach back method. CBE unremarkable.  No mass or lump palpated.  Patient has history of bilateral implants.      Plan:     Sent for bilateral screening mammogram.

## 2016-10-22 ENCOUNTER — Other Ambulatory Visit: Payer: Self-pay | Admitting: *Deleted

## 2016-10-22 ENCOUNTER — Inpatient Hospital Stay
Admission: RE | Admit: 2016-10-22 | Discharge: 2016-10-22 | Disposition: A | Discharge status: Home / Self Care | Payer: Self-pay | Source: Ambulatory Visit | Attending: *Deleted | Admitting: *Deleted

## 2016-10-22 DIAGNOSIS — Z9289 Personal history of other medical treatment: Secondary | ICD-10-CM

## 2016-10-26 NOTE — Progress Notes (Signed)
Letter mailed from [facility] to notify of normal mammogram results.  Patient to return in one year for annual screening.  Copy to [facility]. 

## 2019-03-05 ENCOUNTER — Other Ambulatory Visit: Payer: Self-pay | Admitting: Family Medicine

## 2019-03-05 DIAGNOSIS — Z1231 Encounter for screening mammogram for malignant neoplasm of breast: Secondary | ICD-10-CM

## 2019-03-28 ENCOUNTER — Other Ambulatory Visit: Payer: Self-pay | Admitting: Family Medicine

## 2019-03-28 ENCOUNTER — Ambulatory Visit
Admission: RE | Admit: 2019-03-28 | Discharge: 2019-03-28 | Disposition: A | Discharge status: Home / Self Care | Payer: 59 | Source: Ambulatory Visit | Attending: Family Medicine | Admitting: Family Medicine

## 2019-03-28 DIAGNOSIS — Z1231 Encounter for screening mammogram for malignant neoplasm of breast: Secondary | ICD-10-CM

## 2020-11-15 IMAGING — MG DIGITAL SCREENING BREAST BILAT IMPLANT W/ TOMO W/ CAD
8 of 12 series · 8 of 28 positions shown · non-contrast
Comparison: Previous exam(s).

CLINICAL DATA: Screening.

EXAM:
DIGITAL SCREENING BILATERAL MAMMOGRAM WITH IMPLANTS, CAD AND TOMO
The patient has retropectoral implants. Standard and implant
displaced views were performed.

[L CC]
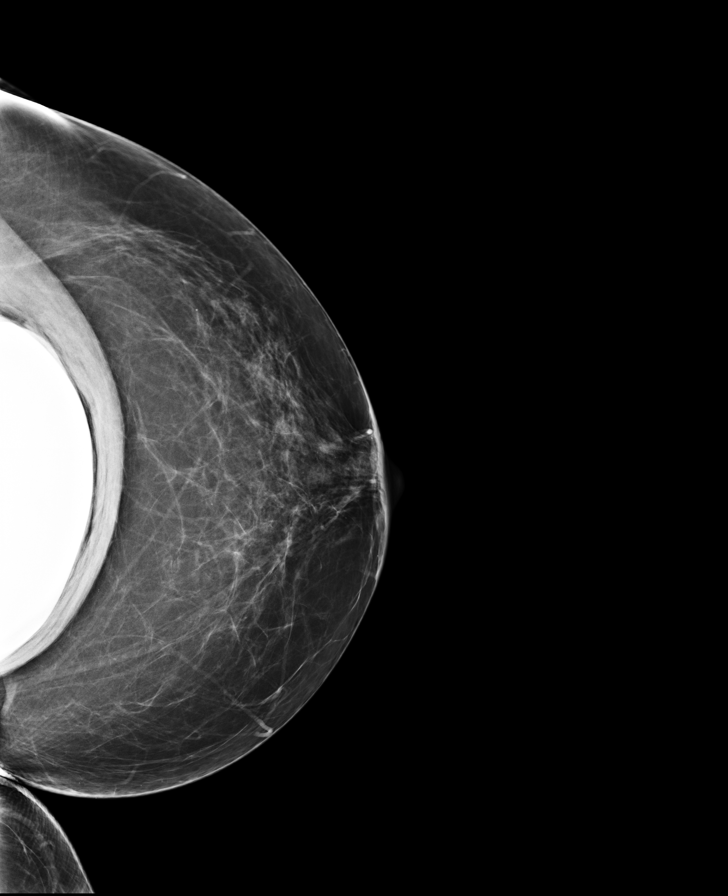

[R CC]
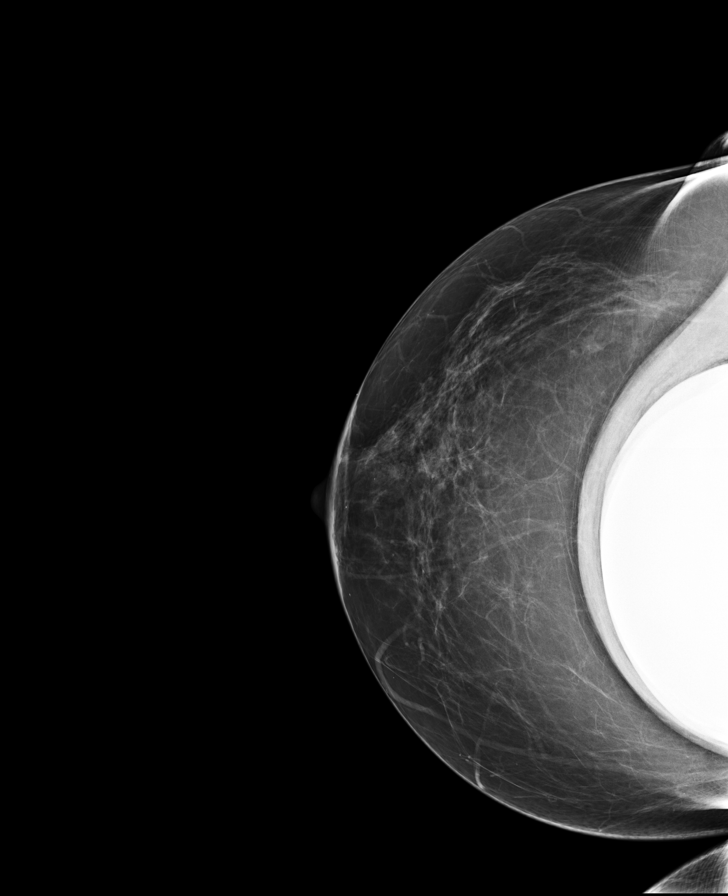

[L MLO]
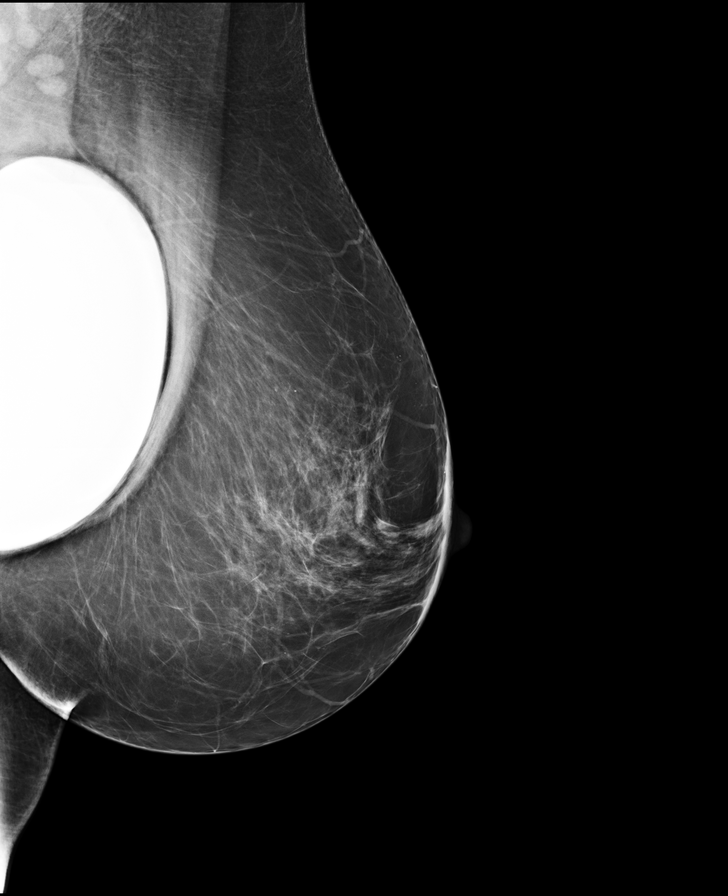

[R MLO]
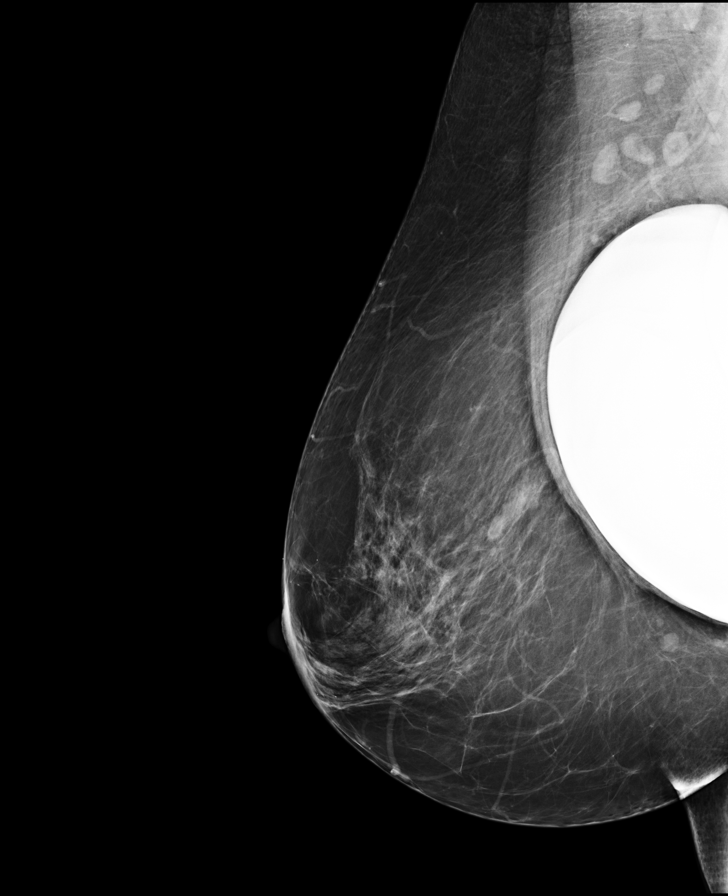

[L CC synth-2D]
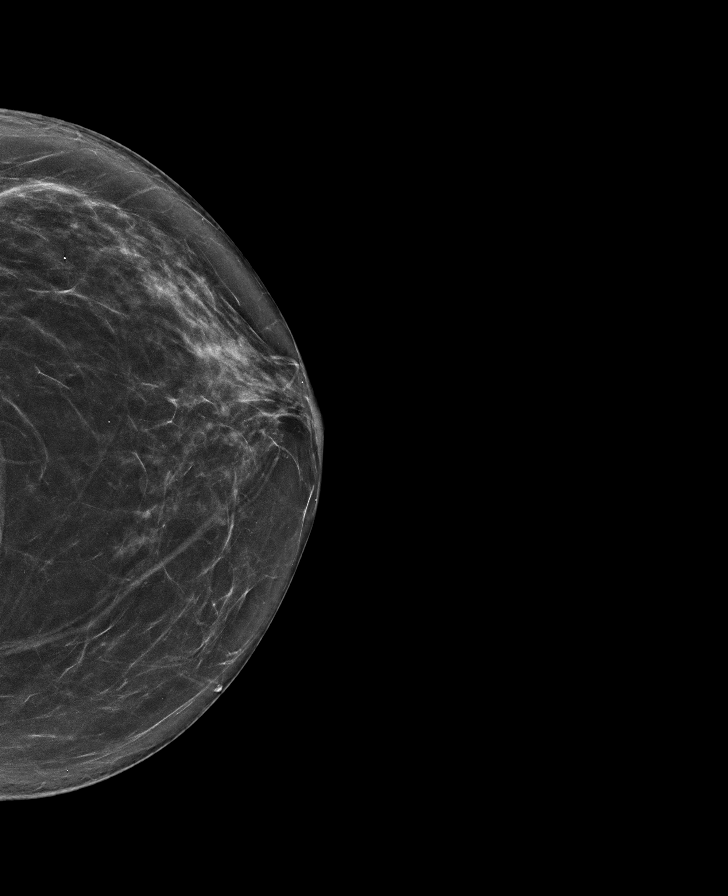

[L MLO synth-2D]
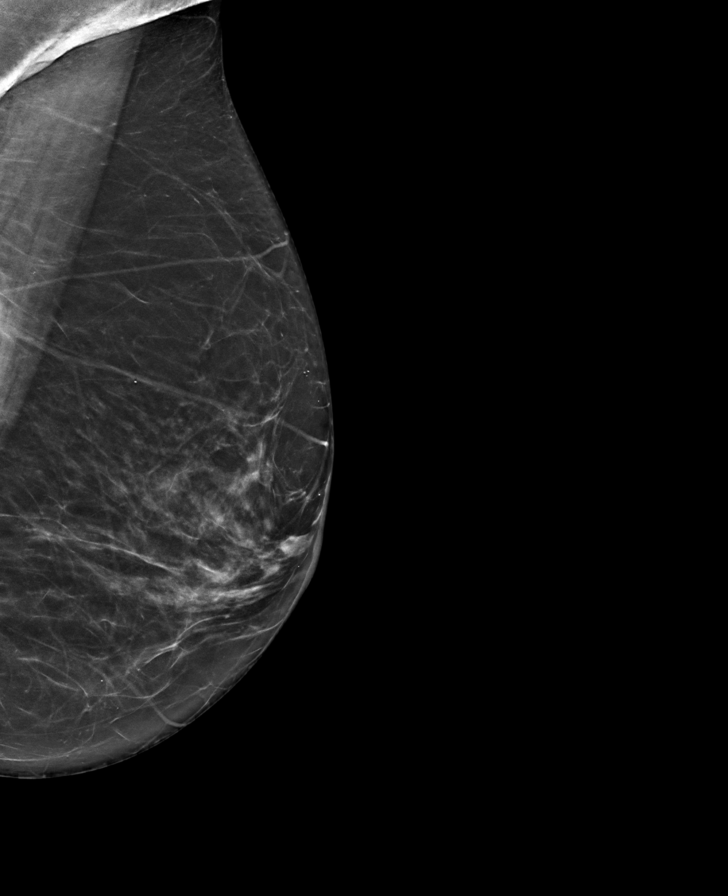

[R CC synth-2D]
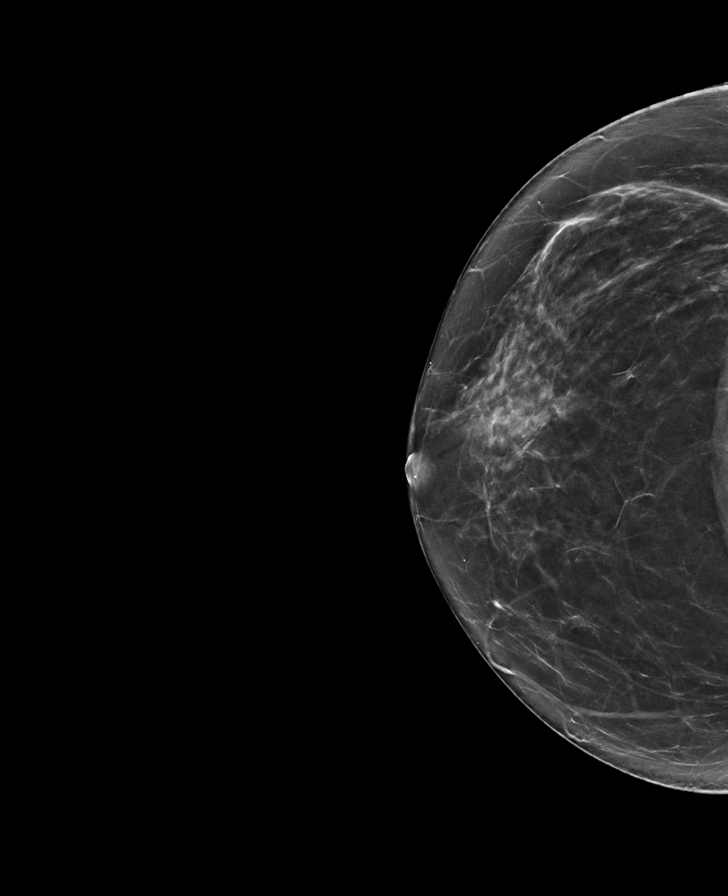

[R MLO synth-2D]
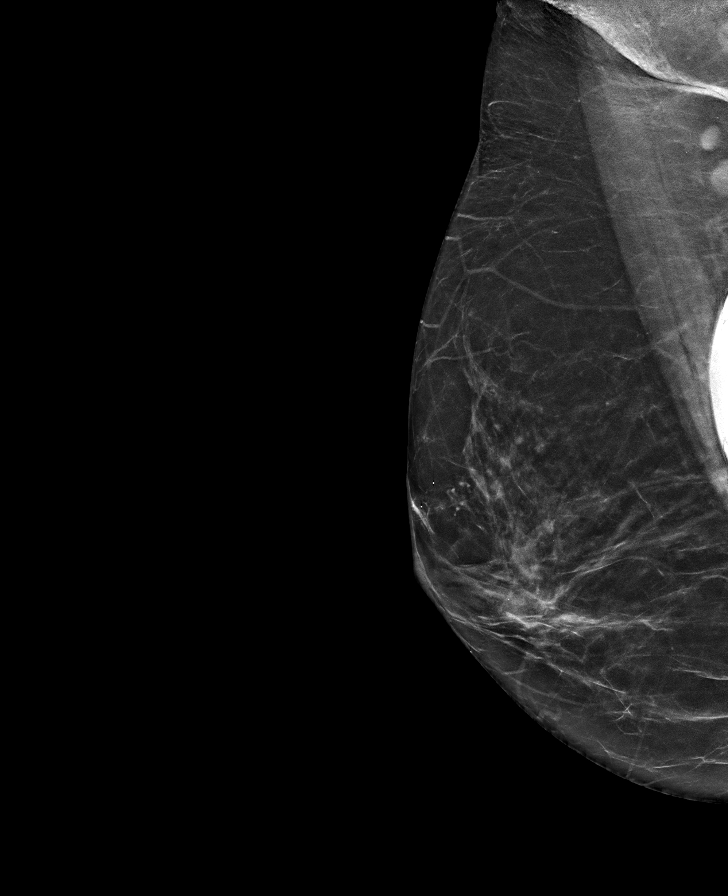

[8 of 28 positions shown; findings below may reference images not displayed]

ACR Breast Density Category b: There are scattered areas of
fibroglandular density.
FINDINGS: There are no findings suspicious for malignancy. Images were
processed with CAD.
IMPRESSION: No mammographic evidence of malignancy. A result letter of this
screening mammogram will be mailed directly to the patient.

RECOMMENDATION:
Screening mammogram in one year. (Code:60-T-8Z4)

BI-RADS CATEGORY  1:  Negative.

## 2021-07-28 ENCOUNTER — Other Ambulatory Visit: Payer: Self-pay | Admitting: Family Medicine

## 2021-07-28 DIAGNOSIS — Z1231 Encounter for screening mammogram for malignant neoplasm of breast: Secondary | ICD-10-CM

## 2021-08-25 ENCOUNTER — Ambulatory Visit
Admission: RE | Admit: 2021-08-25 | Discharge: 2021-08-25 | Disposition: A | Discharge status: Home / Self Care | Payer: Medicaid Other | Source: Ambulatory Visit | Attending: Family Medicine | Admitting: Family Medicine

## 2021-08-25 DIAGNOSIS — Z1231 Encounter for screening mammogram for malignant neoplasm of breast: Secondary | ICD-10-CM | POA: Diagnosis present

## 2022-01-26 ENCOUNTER — Inpatient Hospital Stay: Payer: Medicaid Other | Attending: Oncology | Admitting: Oncology

## 2022-01-26 ENCOUNTER — Inpatient Hospital Stay: Payer: Medicaid Other

## 2022-01-26 ENCOUNTER — Encounter: Payer: Self-pay | Admitting: Oncology

## 2022-01-26 VITALS — BP 129/86 | HR 78 | Temp 97.8°F | Resp 18 | Wt 155.4 lb

## 2022-01-26 DIAGNOSIS — Z803 Family history of malignant neoplasm of breast: Secondary | ICD-10-CM | POA: Diagnosis not present

## 2022-01-26 DIAGNOSIS — B001 Herpesviral vesicular dermatitis: Secondary | ICD-10-CM

## 2022-01-26 DIAGNOSIS — C20 Malignant neoplasm of rectum: Secondary | ICD-10-CM

## 2022-01-26 DIAGNOSIS — Z79899 Other long term (current) drug therapy: Secondary | ICD-10-CM | POA: Diagnosis not present

## 2022-01-26 DIAGNOSIS — C210 Malignant neoplasm of anus, unspecified: Secondary | ICD-10-CM | POA: Insufficient documentation

## 2022-01-26 DIAGNOSIS — Z7189 Other specified counseling: Secondary | ICD-10-CM

## 2022-01-26 LAB — CBC WITH DIFFERENTIAL/PLATELET
Abs Immature Granulocytes: 0.01 10*3/uL (ref 0.00–0.07)
Basophils Absolute: 0.1 10*3/uL (ref 0.0–0.1)
Basophils Relative: 1 %
Eosinophils Absolute: 0.4 10*3/uL (ref 0.0–0.5)
Eosinophils Relative: 5 %
HCT: 34.1 % — ABNORMAL LOW (ref 36.0–46.0)
Hemoglobin: 11.3 g/dL — ABNORMAL LOW (ref 12.0–15.0)
Immature Granulocytes: 0 %
Lymphocytes Relative: 46 %
Lymphs Abs: 3.3 10*3/uL (ref 0.7–4.0)
MCH: 25.1 pg — ABNORMAL LOW (ref 26.0–34.0)
MCHC: 33.1 g/dL (ref 30.0–36.0)
MCV: 75.6 fL — ABNORMAL LOW (ref 80.0–100.0)
Monocytes Absolute: 0.5 10*3/uL (ref 0.1–1.0)
Monocytes Relative: 7 %
Neutro Abs: 3 10*3/uL (ref 1.7–7.7)
Neutrophils Relative %: 41 %
Platelets: 399 10*3/uL (ref 150–400)
RBC: 4.51 MIL/uL (ref 3.87–5.11)
RDW: 14.4 % (ref 11.5–15.5)
WBC: 7.2 10*3/uL (ref 4.0–10.5)
nRBC: 0 % (ref 0.0–0.2)

## 2022-01-26 LAB — HIV ANTIBODY (ROUTINE TESTING W REFLEX): HIV Screen 4th Generation wRfx: NONREACTIVE

## 2022-01-26 LAB — COMPREHENSIVE METABOLIC PANEL
ALT: 17 U/L (ref 0–44)
AST: 24 U/L (ref 15–41)
Albumin: 4.6 g/dL (ref 3.5–5.0)
Alkaline Phosphatase: 46 U/L (ref 38–126)
Anion gap: 10 (ref 5–15)
BUN: 10 mg/dL (ref 6–20)
CO2: 22 mmol/L (ref 22–32)
Calcium: 9.3 mg/dL (ref 8.9–10.3)
Chloride: 99 mmol/L (ref 98–111)
Creatinine, Ser: 0.8 mg/dL (ref 0.44–1.00)
GFR, Estimated: >60 mL/min (ref >60)
Glucose, Bld: 88 mg/dL (ref 70–99)
Potassium: 4 mmol/L (ref 3.5–5.1)
Sodium: 131 mmol/L — ABNORMAL LOW (ref 135–145)
Total Bilirubin: 0.5 mg/dL (ref 0.3–1.2)
Total Protein: 8.6 g/dL — ABNORMAL HIGH (ref 6.5–8.1)

## 2022-01-26 NOTE — Progress Notes (Signed)
Introduced Therapist, nutritional and provided contact information for future needs.

## 2022-01-26 NOTE — Progress Notes (Signed)
Hematology/Oncology Consult Note [phone] [phone]     REFERRING PROVIDER: Sofie Hartigan, MD   Patient Care Team: Sofie Hartigan, MD as PCP - General (Family Medicine) Rico Junker, RN as Registered Nurse Theodore Demark, RN (Inactive) as Registered Nurse Clent Jacks, RN as Oncology Nurse Navigator  CHIEF COMPLAINTS/PURPOSE OF CONSULTATION:  Anal squamous carcinoma  HISTORY OF PRESENTING ILLNESS:  Cheryl Goodwin 61 y.o. female presents to establish care for Anal squamous carcinoma I have reviewed her chart and materials related to her cancer extensively and collaborated history with the patient. 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  Denies any difficulty passing bowel movements, blood in the stool.  01/18/2022, colonoscopy showed anal mass 0 to 1 cm from the anal verge.  Likely malignant tumor in the distal rectum, biopsied.  Nonbleeding internal hemorrhoids.   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  Superficial biopsies, invasion cannot be accessed.   01/26/2022 Cancer Staging   Staging form: Anus, AJCC V9 - Clinical stage from 01/26/2022: cT1, cNX, cM0 - Signed by Earlie Server, MD on 01/26/2022 Stage prefix: Initial diagnosis    Patient is widowed and denies currently sexually active. Denies smoking or alcohol use. She has an upcoming gynecology appointment.  Denies any abdominal pain, cough, shortness of breath nausea vomiting.  Denies any blood in the stool.  MEDICAL HISTORY:  Past Medical History:  Diagnosis Date   Depression    GERD (gastroesophageal reflux disease)    Hyperlipidemia     SURGICAL HISTORY: Past Surgical History:  Procedure  Laterality Date   AUGMENTATION MAMMAPLASTY Bilateral 2005   with lift   BACK SURGERY  09/24/2014   L1 fracture, [facility]   BREAST ENHANCEMENT SURGERY  2007   COLONOSCOPY  2013   ENDOMETRIAL ABLATION  2013   ESOPHAGOGASTRODUODENOSCOPY (EGD) WITH PROPOFOL N/A 07/08/2016   Procedure: ESOPHAGOGASTRODUODENOSCOPY (EGD) WITH PROPOFOL;  Surgeon: Lucilla Lame, MD;  Location: [place];  Service: Endoscopy;  Laterality: N/A;   STOMACH SURGERY  2006   "Tummy Tuck"   TUBAL LIGATION      SOCIAL HISTORY: Social History   Socioeconomic History   Marital status: Widowed    Spouse name: Not on file   Number of children: 2   Years of education: Not on file   Highest education level: Not on file  Occupational History   Occupation: Unemployed  Tobacco Use   Smoking status: Never   Smokeless tobacco: Never  Substance and Sexual Activity   Alcohol use: Yes    Comment: 10 beers/month   Drug use: No   Sexual activity: Not on file  Other Topics Concern   Not on file  Social History Narrative   Not on file   Social Determinants of Health   Financial Resource Strain: Not on file  Food Insecurity: Not on file  Transportation Needs: Not on file  Physical Activity: Not on file  Stress: Not on file  Social Connections: Not on file  Intimate Partner Violence: Not on file    FAMILY HISTORY: Family History  Problem Relation Age of Onset   Stroke Mother    Breast cancer Mother    Emphysema Father        was a smoker  COPD Sister        was a smoker   Cancer Brother     ALLERGIES:  has No Known Allergies.  MEDICATIONS:  Current Outpatient Medications  Medication Sig Dispense Refill   buPROPion (WELLBUTRIN XL) 300 MG 24 hr tablet Take 300 mg by mouth daily.     omeprazole (PRILOSEC) 40 MG capsule Take 1 capsule (40 mg total) by mouth daily. 30 capsule 3   sertraline (ZOLOFT) 100 MG tablet Take 150 mg by mouth daily.     simvastatin (ZOCOR) 20 MG tablet Take 20 mg by mouth every  evening.     No current facility-administered medications for this visit.    Review of Systems  Constitutional:  Negative for appetite change, chills, fatigue and fever.  HENT:   Negative for hearing loss and voice change.   Eyes:  Negative for eye problems.  Respiratory:  Negative for chest tightness and cough.   Cardiovascular:  Negative for chest pain.  Gastrointestinal:  Negative for abdominal distention, abdominal pain and blood in stool.  Endocrine: Negative for hot flashes.  Genitourinary:  Negative for difficulty urinating and frequency.   Musculoskeletal:  Negative for arthralgias.  Skin:  Negative for itching and rash.  Neurological:  Negative for extremity weakness.  Hematological:  Negative for adenopathy.  Psychiatric/Behavioral:  Negative for confusion.      PHYSICAL EXAMINATION: ECOG PERFORMANCE STATUS: 0 - Asymptomatic  Vitals:   01/26/22 1534  BP: 129/86  Pulse: 78  Resp: 18  Temp: 97.8 F (36.6 C)   Filed Weights   01/26/22 1534  Weight: 155 lb 6.4 oz (70.5 kg)    Physical Exam Constitutional:      General: She is not in acute distress.    Appearance: She is not diaphoretic.  HENT:     Head: Normocephalic and atraumatic.     Nose: Nose normal.     Mouth/Throat:     Pharynx: No oropharyngeal exudate.  Eyes:     General: No scleral icterus.    Pupils: Pupils are equal, round, and reactive to light.  Cardiovascular:     Rate and Rhythm: Normal rate and regular rhythm.     Heart sounds: No murmur heard. Pulmonary:     Effort: Pulmonary effort is normal. No respiratory distress.     Breath sounds: No rales.  Chest:     Chest wall: No tenderness.  Abdominal:     General: There is no distension.     Palpations: Abdomen is soft.     Tenderness: There is no abdominal tenderness.  Genitourinary:    Comments: Digital rectal examination showed palpable anterior anal mass Musculoskeletal:        General: Normal range of motion.     Cervical back:  Normal range of motion and neck supple.  Skin:    General: Skin is warm and dry.     Findings: No erythema.  Neurological:     Mental Status: She is alert and oriented to person, place, and time.     Cranial Nerves: No cranial nerve deficit.     Motor: No abnormal muscle tone.     Coordination: Coordination normal.  Psychiatric:        Mood and Affect: Affect normal.      LABORATORY DATA:  I have reviewed the data as listed    Latest Ref Rng & Units 01/26/2022    4:14 PM 11/02/2011    7:51 PM 10/09/2011    6:11 AM  CBC  WBC 4.0 - 10.5 K/uL 7.2   8.8   Hemoglobin 12.0 - 15.0 g/dL 11.3  8.2  9.1   Hematocrit 36.0 - 46.0 % 34.1   27.7   Platelets 150 - 400 K/uL 399   484       Latest Ref Rng & Units 01/26/2022    4:14 PM 10/07/2011    5:28 PM  CMP  Glucose 70 - 99 mg/dL 88  111   BUN 6 - 20 mg/dL 10  6   Creatinine 0.44 - 1.00 mg/dL 0.80  0.88   Sodium 135 - 145 mmol/L 131  136   Potassium 3.5 - 5.1 mmol/L 4.0  3.7   Chloride 98 - 111 mmol/L 99  103   CO2 22 - 32 mmol/L 22  25   Calcium 8.9 - 10.3 mg/dL 9.3  8.8   Total Protein 6.5 - 8.1 g/dL 8.6  7.8   Total Bilirubin 0.3 - 1.2 mg/dL 0.5  0.2   Alkaline Phos 38 - 126 U/L 46  40   AST 15 - 41 U/L 24  26   ALT 0 - 44 U/L 17  19      RADIOGRAPHIC STUDIES: I have personally reviewed the radiological images as listed and agreed with the findings in the report. No results found.  ASSESSMENT & PLAN:  Goals of care, counseling/discussion Discussed with patient  Anal squamous cell carcinoma [facility]) Pathology was reviewed and discussed with patient. At least in situ squamous cell carcinoma, cannot rule out invasive component.-  I recommend CBC, CMP, HIV.  Recommend patient to have pelvic examination by gynecology Obtain staging imaging with CT chest and abdomen with contrast, MRI pelvis rectal/anal protocol. Patient needs to establish care with colorectal surgery Dr. Dema Severin and I will further discuss the case with him to see  if patient will benefit with additional biopsy for clarification of invasive component.   Orders Placed This Encounter  Procedures   MR Pelvis Wo Contrast    Standing Status:   Future    Standing Expiration Date:   01/26/2023    Order Specific Question:   What is the patient's sedation requirement?    Answer:   No Sedation    Order Specific Question:   Does the patient have a pacemaker or implanted devices?    Answer:   No    Order Specific Question:   Preferred imaging location?    Answer:   [facility] (table limit - 550lbs)   CT Chest W Contrast    Within 1-2 weeks    Standing Status:   Future    Standing Expiration Date:   01/26/2023    Order Specific Question:   If indicated for the ordered procedure, I authorize the administration of contrast media per Radiology protocol    Answer:   Yes    Order Specific Question:   Does the patient have a contrast media/X-ray dye allergy?    Answer:   Yes    Order Specific Question:   Is patient pregnant?    Answer:   No    Order Specific Question:   Preferred imaging location?    Answer:   [facility]   CT Abdomen W Contrast    Within 1-2 weeks    Standing Status:   Future    Standing Expiration Date:   01/26/2023    Order Specific Question:   If indicated for the ordered procedure, I authorize the administration of contrast media  per Radiology protocol    Answer:   Yes    Order Specific Question:   Does the patient have a contrast media/X-ray dye allergy?    Answer:   Yes    Order Specific Question:   Is patient pregnant?    Answer:   No    Order Specific Question:   Preferred imaging location?    Answer:   [facility]    Order Specific Question:   Is Oral Contrast requested for this exam?    Answer:   Yes, Per Radiology protocol   CBC with Differential/Platelet    Standing Status:   Future    Number of Occurrences:   1    Standing Expiration Date:   01/27/2023   Comprehensive metabolic panel    Standing  Status:   Future    Number of Occurrences:   1    Standing Expiration Date:   01/26/2023   HIV Antibody (routine testing w rflx)    Standing Status:   Future    Number of Occurrences:   1    Standing Expiration Date:   01/27/2023   To be determined.   All questions were answered. The patient knows to call the clinic with any problems, questions or concerns. No barriers to learning was detected.  Earlie Server, MD 01/26/2022

## 2022-01-26 NOTE — Assessment & Plan Note (Signed)
Discussed with patient

## 2022-01-26 NOTE — Assessment & Plan Note (Addendum)
Pathology was reviewed and discussed with patient. At least in situ squamous cell carcinoma, cannot rule out invasive component.-  I recommend CBC, CMP, HIV.  Recommend patient to have pelvic examination by gynecology Obtain staging imaging with CT chest and abdomen with contrast, MRI pelvis rectal/anal protocol. Patient needs to establish care with colorectal surgery Dr. Dema Severin and I will further discuss the case with him to see if patient will benefit with additional biopsy for clarification of invasive component.

## 2022-01-27 ENCOUNTER — Encounter: Payer: Self-pay | Admitting: Oncology

## 2022-01-27 NOTE — Telephone Encounter (Signed)
All labs have resulted.

## 2022-01-29 ENCOUNTER — Encounter: Payer: Self-pay | Admitting: Oncology

## 2022-01-29 ENCOUNTER — Ambulatory Visit
Admission: RE | Admit: 2022-01-29 | Discharge: 2022-01-29 | Disposition: A | Discharge status: Home / Self Care | Payer: Medicaid Other | Source: Ambulatory Visit | Attending: Oncology | Admitting: Oncology

## 2022-01-29 DIAGNOSIS — C20 Malignant neoplasm of rectum: Secondary | ICD-10-CM

## 2022-01-29 MED ORDER — IOHEXOL 300 MG/ML  SOLN
100.0000 mL | Freq: Once | INTRAMUSCULAR | Status: AC | PRN
  Administered 2022-01-29: 100 mL via INTRAVENOUS

## 2022-02-01 ENCOUNTER — Telehealth: Payer: Self-pay | Admitting: *Deleted

## 2022-02-01 NOTE — Telephone Encounter (Signed)
Called report  IMPRESSION: 1. No evidence of metastatic disease in the abdomen. 2. No definite findings of metastatic disease in the chest. Two solid pulmonary nodules, largest 0.5 cm in the right lower lobe. Recommend attention on follow-up chest CT in 3 months. 3. Mild patchy consolidation in the posterior right upper lobe with mild to moderate patchy ground-glass opacities in the right upper, right middle and right lower lobes, most suggestive of a mild multilobar pneumonia. 4. Moderate hiatal hernia. 5. Moderate to severe L1 vertebral compression fracture, chronic appearing.   These results will be called to the ordering clinician or representative by the Radiologist Assistant, and communication documented in the PACS or [employer].     Electronically Signed   By: Ilona Sorrel M.D.   On: 01/29/2022 19:10

## 2022-02-02 ENCOUNTER — Ambulatory Visit
Admission: RE | Admit: 2022-02-02 | Discharge: 2022-02-02 | Disposition: A | Discharge status: Home / Self Care | Payer: Medicaid Other | Source: Ambulatory Visit | Attending: Oncology | Admitting: Oncology

## 2022-02-02 DIAGNOSIS — C20 Malignant neoplasm of rectum: Secondary | ICD-10-CM | POA: Insufficient documentation

## 2022-02-02 NOTE — Telephone Encounter (Signed)
Call to patient who denies shortness of breath or coughing Denies fever as well

## 2022-02-03 ENCOUNTER — Encounter: Payer: Self-pay | Admitting: Oncology

## 2022-02-03 ENCOUNTER — Other Ambulatory Visit: Payer: Self-pay | Admitting: Oncology

## 2022-02-03 NOTE — Progress Notes (Signed)
START ON PATHWAY REGIMEN - Anal Carcinoma     One cycle, concurrent with RT:     Fluorouracil      Mitomycin   **Always confirm dose/schedule in your pharmacy ordering system**  Patient Characteristics: Anal Canal Tumors, Newly Diagnosed - Locoregional Disease (Clinical Staging) Therapeutic Status: Newly Diagnosed - Locoregional Disease (Clinical Staging) AJCC T Category: cT1 AJCC N Category: cN1 AJCC M Category: cM0 AJCC 9 Stage Grouping: IIB Check here if patient was staged using an edition other than AJCC Staging 9th Edition: false Intent of Therapy: Curative Intent, Discussed with Patient

## 2022-02-04 ENCOUNTER — Telehealth: Payer: Self-pay

## 2022-02-04 NOTE — Telephone Encounter (Signed)
Called and confirmed appointment with Dr. Tasia Catchings for 12/8 at 1115.

## 2022-02-05 ENCOUNTER — Inpatient Hospital Stay: Payer: Medicaid Other | Attending: Oncology | Admitting: Oncology

## 2022-02-05 ENCOUNTER — Other Ambulatory Visit: Payer: Self-pay | Admitting: Oncology

## 2022-02-05 ENCOUNTER — Encounter: Payer: Self-pay | Admitting: Oncology

## 2022-02-05 VITALS — BP 130/86 | HR 84 | Temp 97.8°F | Wt 154.3 lb

## 2022-02-05 DIAGNOSIS — Z7189 Other specified counseling: Secondary | ICD-10-CM | POA: Diagnosis not present

## 2022-02-05 DIAGNOSIS — C210 Malignant neoplasm of anus, unspecified: Secondary | ICD-10-CM | POA: Diagnosis present

## 2022-02-05 DIAGNOSIS — Z79899 Other long term (current) drug therapy: Secondary | ICD-10-CM | POA: Insufficient documentation

## 2022-02-05 DIAGNOSIS — R918 Other nonspecific abnormal finding of lung field: Secondary | ICD-10-CM | POA: Diagnosis not present

## 2022-02-05 DIAGNOSIS — C20 Malignant neoplasm of rectum: Secondary | ICD-10-CM | POA: Insufficient documentation

## 2022-02-05 DIAGNOSIS — D509 Iron deficiency anemia, unspecified: Secondary | ICD-10-CM | POA: Diagnosis not present

## 2022-02-05 NOTE — Progress Notes (Unsigned)
New referral and message sent to Dr. Dema Severin for port a cath placement.

## 2022-02-06 ENCOUNTER — Encounter: Payer: Self-pay | Admitting: Oncology

## 2022-02-06 DIAGNOSIS — R918 Other nonspecific abnormal finding of lung field: Secondary | ICD-10-CM | POA: Insufficient documentation

## 2022-02-06 MED ORDER — LIDOCAINE-PRILOCAINE 2.5-2.5 % EX CREA: TOPICAL_CREAM | CUTANEOUS | 3 refills | Status: DC

## 2022-02-06 MED ORDER — PROCHLORPERAZINE MALEATE 10 MG PO TABS: 10.0000 mg | ORAL_TABLET | Freq: Four times a day (QID) | ORAL | 1 refills | Status: DC | PRN

## 2022-02-06 MED ORDER — ONDANSETRON HCL 8 MG PO TABS: 8.0000 mg | ORAL_TABLET | Freq: Three times a day (TID) | ORAL | 1 refills | Status: DC | PRN

## 2022-02-06 NOTE — Assessment & Plan Note (Signed)
Pathology and images were reviewed and discussed with patient. 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  Discussed with surgery Dr.White.  Recommend concurrent chemotherapy 5-FU/mitomycin C and radiation - Modified Nigro protocol. I explained to the patient the risks and benefits of chemotherapy including all but not limited to infusion reaction, hair loss, mouth sore, nausea, vomiting, low blood counts, bleeding, heart failure, kidney failure and risk of life threatening infection and even death, secondary malignancy etc.   Patient voices understanding and willing to proceed chemotherapy.  Refer to Radiation Oncology  # Chemotherapy education; will ask Dr.White to place port placement.  # Antiemetics-Zofran and Compazine; EMLA cream sent to pharmacy

## 2022-02-06 NOTE — Assessment & Plan Note (Addendum)
Non specific. Attention on follow up  CT also showed patchy consolidation of right lung, clinically patient denies any sob, chest pain or cough.

## 2022-02-06 NOTE — Assessment & Plan Note (Signed)
Discussed with patient

## 2022-02-06 NOTE — Progress Notes (Signed)
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  Discussed with surgery Dr.White.  Recommend concurrent chemotherapy 5-FU/mitomycin C and radiation - Modified Nigro protocol. I explained to the patient the risks and benefits of chemotherapy including all but not limited to infusion reaction, hair loss, mouth sore, nausea, vomiting, low blood counts, bleeding, heart failure, kidney failure and risk of life threatening infection and even death, secondary malignancy etc.   Patient voices understanding and willing to proceed chemotherapy.  Refer to Radiation Oncology  # Chemotherapy education; will ask Dr.White to place port placement.  # Antiemetics-Zofran and Compazine; EMLA cream sent to pharmacy   Goals of care, counseling/discussion Discussed with patient  Lung nodules Non specific. Attention on follow up  CT also showed patchy consolidation of right lung, clinically patient denies any sob, chest pain or cough.   Microcytic anemia Check iron panel   Orders Placed This Encounter  Procedures   Ambulatory referral to Radiation Oncology    Referral Priority:   Routine    Referral Type:   Consultation    Referral Reason:   Specialty Services Required    Requested Specialty:   Radiation Oncology    Number of Visits Requested:   1   Follow up to be determined, awaiting Radiation schedule.  All questions were answered. The patient knows to call the clinic with any problems, questions or concerns.  Earlie Server,  MD, PhD [facility] Hematology Oncology 02/05/2022     CHIEF COMPLAINTS/PURPOSE OF CONSULTATION:  Anal squamous carcinoma  HISTORY OF PRESENTING ILLNESS:  Cheryl Goodwin 61 y.o. female presents to establish care for Anal squamous carcinoma I have reviewed her chart and materials related to her cancer extensively and collaborated history with the patient. 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  Denies any difficulty passing bowel movements, blood in the stool.  01/18/2022, colonoscopy showed anal mass 0 to 1 cm from the anal verge.  Likely malignant tumor in the distal rectum, biopsied.  Nonbleeding internal hemorrhoids.   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  Superficial biopsies, invasion cannot be accessed.   01/29/2022 Imaging   CT chest abdomen w contrast  1. No evidence of metastatic disease in the abdomen. 2. No definite findings of metastatic disease in the chest. Two solid pulmonary nodules, largest 0.5 cm in the right lower lobe.Recommend attention on follow-up chest CT in 3 months. 3. Mild patchy consolidation in the posterior right upper lobe with mild to moderate patchy ground-glass opacities in the right upper,right middle and right lower lobes, most suggestive of a mild multilobar pneumonia. 4. Moderate hiatal hernia. 5. Moderate to severe L1 vertebral compression fracture, chronic appearing.   02/03/2022 Imaging   MR pelvis wo contrast 1. Anorectal mass extending from the anterior and lateral LEFT low rectum through the anal  canal and involving the LEFT sphincter complex to include the intersphincteric plane with abutment and potential early involvement of external sphincter. Also with  suspicion for early encroachment upon the lower rectovaginal plane with extension towards the lower vagina. 2. Signs of LEFT inguinal nodal disease. Other small irregular lymph nodes particularly on the LEFT suspicious with a single necrotic appearing node that is highly suspicious for if not diagnostic of inguinal nodal involvement. Note that the full pelvis is not assessed on this anorectal focused evaluation. No adenopathy seen along the pelvic sidewalls or about the mesorectum.      02/08/2022 -  Chemotherapy   Patient is on Treatment Plan : ANUS Mitomycin D1,28 + 5FU D1-4, 28-31 q32d      Patient is widowed and current not sexually active. Denies smoking or alcohol use. She has an upcoming gynecology appointment.  Denies any abdominal pain, cough, shortness of breath nausea vomiting.  Denies any blood in the stool.  She has established care with Dr.White and was recommended to proceed with concurrent chemotherapy and Radiation. Patient will see Dr.White in about 2 months.     MEDICAL HISTORY:  Past Medical History:  Diagnosis Date   Depression    GERD (gastroesophageal reflux disease)    Hyperlipidemia     SURGICAL HISTORY: Past Surgical History:  Procedure Laterality Date   AUGMENTATION MAMMAPLASTY Bilateral 2005   with lift   BACK SURGERY  09/24/2014   L1 fracture, [facility]   BREAST ENHANCEMENT SURGERY  2007   COLONOSCOPY  2013   ENDOMETRIAL ABLATION  2013   ESOPHAGOGASTRODUODENOSCOPY (EGD) WITH PROPOFOL N/A 07/08/2016   Procedure: ESOPHAGOGASTRODUODENOSCOPY (EGD) WITH PROPOFOL;  Surgeon: Lucilla Lame, MD;  Location: [place];  Service: Endoscopy;  Laterality: N/A;   STOMACH SURGERY  2006   "Tummy Tuck"   TUBAL LIGATION      SOCIAL HISTORY: Social History   Socioeconomic History   Marital status: Widowed    Spouse name: Not on file   Number of children: 2   Years of education: Not on file   Highest education level: Not on file  Occupational History    Occupation: Unemployed  Tobacco Use   Smoking status: Never   Smokeless tobacco: Never  Substance and Sexual Activity   Alcohol use: Yes    Comment: 10 beers/month   Drug use: No   Sexual activity: Not on file  Other Topics Concern   Not on file  Social History Narrative   Not on file   Social Determinants of Health   Financial Resource Strain: Not on file  Food Insecurity: Not on file  Transportation Needs: Not on file  Physical Activity: Not on file  Stress: Not on file  Social Connections: Not on file  Intimate Partner Violence: Not on file    FAMILY HISTORY: Family History  Problem Relation Age of Onset   Stroke Mother    Breast cancer Mother    Emphysema Father        was a smoker   COPD Sister        was a smoker   Cancer Brother     ALLERGIES:  has No Known Allergies.  MEDICATIONS:  Current Outpatient Medications  Medication Sig Dispense Refill   buPROPion (WELLBUTRIN XL) 300 MG 24 hr tablet Take 300 mg by mouth daily.     omeprazole (PRILOSEC) 40 MG capsule Take 1 capsule (40 mg total) by mouth daily. 30 capsule 3   sertraline (ZOLOFT) 100 MG tablet  Take 150 mg by mouth daily.     simvastatin (ZOCOR) 20 MG tablet Take 20 mg by mouth every evening.     No current facility-administered medications for this visit.    Review of Systems  Constitutional:  Negative for appetite change, chills, fatigue and fever.  HENT:   Negative for hearing loss and voice change.   Eyes:  Negative for eye problems.  Respiratory:  Negative for chest tightness and cough.   Cardiovascular:  Negative for chest pain.  Gastrointestinal:  Negative for abdominal distention, abdominal pain and blood in stool.  Endocrine: Negative for hot flashes.  Genitourinary:  Negative for difficulty urinating and frequency.   Musculoskeletal:  Negative for arthralgias.  Skin:  Negative for itching and rash.  Neurological:  Negative for extremity weakness.  Hematological:  Negative for  adenopathy.  Psychiatric/Behavioral:  Negative for confusion.      PHYSICAL EXAMINATION: ECOG PERFORMANCE STATUS: 0 - Asymptomatic  Vitals:   02/05/22 1119  BP: 130/86  Pulse: 84  Temp: 97.8 F (36.6 C)  SpO2: 100%   Filed Weights   02/05/22 1119  Weight: 154 lb 4.8 oz (70 kg)    Physical Exam Constitutional:      General: She is not in acute distress.    Appearance: She is not diaphoretic.  HENT:     Head: Normocephalic and atraumatic.     Nose: Nose normal.     Mouth/Throat:     Pharynx: No oropharyngeal exudate.  Eyes:     General: No scleral icterus.    Pupils: Pupils are equal, round, and reactive to light.  Cardiovascular:     Rate and Rhythm: Normal rate and regular rhythm.     Heart sounds: No murmur heard. Pulmonary:     Effort: Pulmonary effort is normal. No respiratory distress.     Breath sounds: No rales.  Chest:     Chest wall: No tenderness.  Abdominal:     General: There is no distension.     Palpations: Abdomen is soft.     Tenderness: There is no abdominal tenderness.  Genitourinary:    Comments: Digital rectal examination showed palpable anterior anal mass Musculoskeletal:        General: Normal range of motion.     Cervical back: Normal range of motion and neck supple.  Skin:    General: Skin is warm and dry.     Findings: No erythema.  Neurological:     Mental Status: She is alert and oriented to person, place, and time.     Cranial Nerves: No cranial nerve deficit.     Motor: No abnormal muscle tone.     Coordination: Coordination normal.  Psychiatric:        Mood and Affect: Affect normal.      LABORATORY DATA:  I have reviewed the data as listed    Latest Ref Rng & Units 01/26/2022    4:14 PM 11/02/2011    7:51 PM 10/09/2011    6:11 AM  CBC  WBC 4.0 - 10.5 K/uL 7.2   8.8   Hemoglobin 12.0 - 15.0 g/dL 11.3  8.2  9.1   Hematocrit 36.0 - 46.0 % 34.1   27.7   Platelets 150 - 400 K/uL 399   484       Latest Ref Rng & Units  01/26/2022    4:14 PM 10/07/2011    5:28 PM  CMP  Glucose 70 - 99 mg/dL 88  111  BUN 6 - 20 mg/dL 10  6   Creatinine 0.44 - 1.00 mg/dL 0.80  0.88   Sodium 135 - 145 mmol/L 131  136   Potassium 3.5 - 5.1 mmol/L 4.0  3.7   Chloride 98 - 111 mmol/L 99  103   CO2 22 - 32 mmol/L 22  25   Calcium 8.9 - 10.3 mg/dL 9.3  8.8   Total Protein 6.5 - 8.1 g/dL 8.6  7.8   Total Bilirubin 0.3 - 1.2 mg/dL 0.5  0.2   Alkaline Phos 38 - 126 U/L 46  40   AST 15 - 41 U/L 24  26   ALT 0 - 44 U/L 17  19      RADIOGRAPHIC STUDIES: I have personally reviewed the radiological images as listed and agreed with the findings in the report. MR Pelvis Wo Contrast  Result Date: 02/03/2022 CLINICAL DATA:  History of anal-rectal cancer. EXAM: MRI PELVIS WITHOUT CONTRAST TECHNIQUE: Multiplanar multisequence MR imaging of the pelvis was performed. No intravenous contrast was administered. COMPARISON:  January 29, 2022 CT of the chest and abdomen. FINDINGS: Urinary Tract:  Unremarkable. Bowel: Signs of anorectal mass involving the low rectum and extending into the anal canal and involving the anal sphincter. Mass is eccentric favoring the LEFT distal rectum and anus. On image 23/8 there is extension beyond the muscular layer of the anus into the internal sphincter and into the intersphincteric plane. This is present greatest from the 12 o'clock to 2 o'clock position though also with scattered areas of involvement in the upper and or canal at the 3 o'clock and 5 o'clock positions. Areas associated with restricted diffusion. Tumor tracks nearly entirely through the anal canal with less invasive features seen along the inferior aspect of the anal canal as compared to the upper inguinal canal. Internal portion of the tumor is anterior and LEFT lateral. More bulky area of tumor is at the anorectal junction (image 18/8 where tumor within the rectum measures approximately 2.2 x 1.5 cm. Tumor in total involving approximately 4 cm length  of the distal rectum and anal canal. On sagittal images there is some loss of the fat plane anterior to the rectum between rectum and vagina (image 22/2.) tumor likely abuts the lower vagina, at least extending along the lower margin of the rectovaginal plain into the fat plane between these viscera. Vascular/Lymphatic: Irregular LEFT inguinal lymph node measuring 13 mm with mixed T2 signal and restricted diffusion suspicious for nodal involvement from anal cancer. Scattered smaller nodes in the bilateral inguinal regions also with suspicious features more so on the LEFT than the RIGHT high iliac nodes are not assessed on this rectal focused evaluation. There is no vascular dilation. Reproductive: Reproductive structures grossly unremarkable on this anorectal evaluation. Other:  No pelvic ascites. Musculoskeletal: No suspicious bone lesions identified. IMPRESSION: 1. Anorectal mass extending from the anterior and lateral LEFT low rectum through the anal canal and involving the LEFT sphincter complex to include the intersphincteric plane with abutment and potential early involvement of external sphincter. Also with suspicion for early encroachment upon the lower rectovaginal plane with extension towards the lower vagina. 2. Signs of LEFT inguinal nodal disease. Other small irregular lymph nodes particularly on the LEFT suspicious with a single necrotic appearing node that is highly suspicious for if not diagnostic of inguinal nodal involvement. Note that the full pelvis is not assessed on this anorectal focused evaluation. No adenopathy seen along the pelvic sidewalls or about the mesorectum.  Electronically Signed   By: Zetta Bills M.D.   On: 02/03/2022 09:15   CT Chest W Contrast  Result Date: 01/29/2022 CLINICAL DATA:  Newly diagnosed anal squamous cell carcinoma. Staging evaluation. * Tracking Code: BO * EXAM: CT CHEST AND ABDOMEN WITH CONTRAST TECHNIQUE: Multidetector CT imaging of the chest and abdomen  was performed following the standard protocol during bolus administration of intravenous contrast. RADIATION DOSE REDUCTION: This exam was performed according to the departmental dose-optimization program which includes automated exposure control, adjustment of the mA and/or kV according to patient size and/or use of iterative reconstruction technique. CONTRAST:  126m OMNIPAQUE IOHEXOL 300 MG/ML  SOLN COMPARISON:  None Available. FINDINGS: CT CHEST FINDINGS Cardiovascular: Normal heart size. No significant pericardial effusion/thickening. Great vessels are normal in course and caliber. No central pulmonary emboli. Mediastinum/Nodes: No significant thyroid nodules. Unremarkable esophagus. No pathologically enlarged axillary, mediastinal or hilar lymph nodes. Lungs/Pleura: No pneumothorax. No pleural effusion. Mild patchy consolidation in the posterior right upper lobe with mild to moderate patchy ground-glass opacities in the right upper, right middle and right lower lobes. Indistinct peripheral right lower lobe 0.5 cm solid pulmonary nodule (series 3/image 90) and solid 0.4 cm peripheral basilar left lower lobe pulmonary nodule (series 3/image 115). Musculoskeletal: No aggressive appearing focal osseous lesions. Bilateral posterior spinal fusion hardware extending from T11 to L3. Mild thoracic spondylosis. Bilateral breast prostheses noted. CT ABDOMEN FINDINGS Hepatobiliary: Normal liver with no liver mass. Normal gallbladder with no radiopaque cholelithiasis. No biliary ductal dilatation. Pancreas: Normal, with no mass or duct dilation. Spleen: Normal size. No mass. Adrenals/Urinary Tract: Normal adrenals. Normal kidneys with no hydronephrosis and no renal mass. Stomach/Bowel: Moderate hiatal hernia. Otherwise normal nondistended stomach. No dilated or thick-walled small or large bowel loops in the abdomen. Oral contrast transits to the left colon. Vascular/Lymphatic: Normal caliber abdominal aorta. Patent portal,  splenic, hepatic and renal veins. No pathologically enlarged lymph nodes in the abdomen or pelvis. Other: No pneumoperitoneum, ascites or focal fluid collection. Musculoskeletal: No aggressive appearing focal osseous lesions. Moderate to severe L1 vertebral compression fracture, chronic appearing. Mild lumbar spondylosis. IMPRESSION: 1. No evidence of metastatic disease in the abdomen. 2. No definite findings of metastatic disease in the chest. Two solid pulmonary nodules, largest 0.5 cm in the right lower lobe. Recommend attention on follow-up chest CT in 3 months. 3. Mild patchy consolidation in the posterior right upper lobe with mild to moderate patchy ground-glass opacities in the right upper, right middle and right lower lobes, most suggestive of a mild multilobar pneumonia. 4. Moderate hiatal hernia. 5. Moderate to severe L1 vertebral compression fracture, chronic appearing. These results will be called to the ordering clinician or representative by the Radiologist Assistant, and communication documented in the PACS or [employer] Electronically Signed   By: JIlona SorrelM.D.   On: 01/29/2022 19:10   CT Abdomen W Contrast  Result Date: 01/29/2022 CLINICAL DATA:  Newly diagnosed anal squamous cell carcinoma. Staging evaluation. * Tracking Code: BO * EXAM: CT CHEST AND ABDOMEN WITH CONTRAST TECHNIQUE: Multidetector CT imaging of the chest and abdomen was performed following the standard protocol during bolus administration of intravenous contrast. RADIATION DOSE REDUCTION: This exam was performed according to the departmental dose-optimization program which includes automated exposure control, adjustment of the mA and/or kV according to patient size and/or use of iterative reconstruction technique. CONTRAST:  1097mOMNIPAQUE IOHEXOL 300 MG/ML  SOLN COMPARISON:  None Available. FINDINGS: CT CHEST FINDINGS Cardiovascular: Normal heart size. No significant  pericardial effusion/thickening. Great vessels  are normal in course and caliber. No central pulmonary emboli. Mediastinum/Nodes: No significant thyroid nodules. Unremarkable esophagus. No pathologically enlarged axillary, mediastinal or hilar lymph nodes. Lungs/Pleura: No pneumothorax. No pleural effusion. Mild patchy consolidation in the posterior right upper lobe with mild to moderate patchy ground-glass opacities in the right upper, right middle and right lower lobes. Indistinct peripheral right lower lobe 0.5 cm solid pulmonary nodule (series 3/image 90) and solid 0.4 cm peripheral basilar left lower lobe pulmonary nodule (series 3/image 115). Musculoskeletal: No aggressive appearing focal osseous lesions. Bilateral posterior spinal fusion hardware extending from T11 to L3. Mild thoracic spondylosis. Bilateral breast prostheses noted. CT ABDOMEN FINDINGS Hepatobiliary: Normal liver with no liver mass. Normal gallbladder with no radiopaque cholelithiasis. No biliary ductal dilatation. Pancreas: Normal, with no mass or duct dilation. Spleen: Normal size. No mass. Adrenals/Urinary Tract: Normal adrenals. Normal kidneys with no hydronephrosis and no renal mass. Stomach/Bowel: Moderate hiatal hernia. Otherwise normal nondistended stomach. No dilated or thick-walled small or large bowel loops in the abdomen. Oral contrast transits to the left colon. Vascular/Lymphatic: Normal caliber abdominal aorta. Patent portal, splenic, hepatic and renal veins. No pathologically enlarged lymph nodes in the abdomen or pelvis. Other: No pneumoperitoneum, ascites or focal fluid collection. Musculoskeletal: No aggressive appearing focal osseous lesions. Moderate to severe L1 vertebral compression fracture, chronic appearing. Mild lumbar spondylosis. IMPRESSION: 1. No evidence of metastatic disease in the abdomen. 2. No definite findings of metastatic disease in the chest. Two solid pulmonary nodules, largest 0.5 cm in the right lower lobe. Recommend attention on follow-up chest CT  in 3 months. 3. Mild patchy consolidation in the posterior right upper lobe with mild to moderate patchy ground-glass opacities in the right upper, right middle and right lower lobes, most suggestive of a mild multilobar pneumonia. 4. Moderate hiatal hernia. 5. Moderate to severe L1 vertebral compression fracture, chronic appearing. These results will be called to the ordering clinician or representative by the Radiologist Assistant, and communication documented in the PACS or [employer]. Electronically Signed   By: Ilona Sorrel M.D.   On: 01/29/2022 19:10

## 2022-02-06 NOTE — Assessment & Plan Note (Signed)
Check iron panel. 

## 2022-02-07 ENCOUNTER — Other Ambulatory Visit: Payer: Self-pay

## 2022-02-08 ENCOUNTER — Telehealth: Payer: Self-pay

## 2022-02-08 ENCOUNTER — Other Ambulatory Visit: Payer: Self-pay

## 2022-02-08 DIAGNOSIS — C210 Malignant neoplasm of anus, unspecified: Secondary | ICD-10-CM

## 2022-02-08 NOTE — Telephone Encounter (Signed)
Notified by Dr. Dema Severin that he does not have any OR time availability and would like port placed by IR. Order placed to IR.

## 2022-02-09 ENCOUNTER — Telehealth: Payer: Self-pay | Admitting: *Deleted

## 2022-02-09 ENCOUNTER — Inpatient Hospital Stay: Payer: Medicaid Other

## 2022-02-09 NOTE — Telephone Encounter (Signed)
Form signed, call to patient and I had to leave voice mail that iot is ready to be picked up and that she will need to call billing department for itemized statements

## 2022-02-09 NOTE — Telephone Encounter (Signed)
Received a form for patient cancer policy to complete. And request for itemized billing related to this diagnosed. Unable to obtain billing as requested, patient will need to contact billing department and also [facility]. I did print a copy of the pathology report for patient and ut diagnosis on form and sent from for physician signature

## 2022-02-10 ENCOUNTER — Other Ambulatory Visit (HOSPITAL_COMMUNITY): Payer: Self-pay | Admitting: Student

## 2022-02-10 NOTE — Progress Notes (Signed)
Patient for IR Port Placement on Thurs 02/11/2022, I called and spoke with the patient on the phone and gave pre-procedure instructions. Pt was made aware to be here at 12:30p at the new entrance, NPO after MN prior to procedure as well as driver post procedure/recovery/discharge. Pt stated understanding.  Called 02/10/2022

## 2022-02-11 ENCOUNTER — Other Ambulatory Visit: Payer: Self-pay

## 2022-02-11 ENCOUNTER — Ambulatory Visit
Admission: RE | Admit: 2022-02-11 | Discharge: 2022-02-11 | Disposition: A | Discharge status: Home / Self Care | Payer: Medicaid Other | Source: Ambulatory Visit | Attending: Oncology | Admitting: Oncology

## 2022-02-11 ENCOUNTER — Encounter: Payer: Self-pay | Admitting: Radiology

## 2022-02-11 DIAGNOSIS — C211 Malignant neoplasm of anal canal: Secondary | ICD-10-CM | POA: Insufficient documentation

## 2022-02-11 DIAGNOSIS — E785 Hyperlipidemia, unspecified: Secondary | ICD-10-CM | POA: Diagnosis not present

## 2022-02-11 DIAGNOSIS — K219 Gastro-esophageal reflux disease without esophagitis: Secondary | ICD-10-CM | POA: Insufficient documentation

## 2022-02-11 DIAGNOSIS — F32A Depression, unspecified: Secondary | ICD-10-CM | POA: Insufficient documentation

## 2022-02-11 DIAGNOSIS — C210 Malignant neoplasm of anus, unspecified: Secondary | ICD-10-CM

## 2022-02-11 HISTORY — PX: IR IMAGING GUIDED PORT INSERTION: IMG5740

## 2022-02-11 MED ORDER — FENTANYL CITRATE (PF) 100 MCG/2ML IJ SOLN
INTRAMUSCULAR | Status: AC
  Filled 2022-02-11: qty 2

## 2022-02-11 MED ORDER — SODIUM CHLORIDE 0.9 % IV SOLN: INTRAVENOUS | Status: DC

## 2022-02-11 MED ORDER — HEPARIN SOD (PORK) LOCK FLUSH 100 UNIT/ML IV SOLN
INTRAVENOUS | Status: AC
  Administered 2022-02-11: 500 [IU]
  Filled 2022-02-11: qty 5

## 2022-02-11 MED ORDER — FENTANYL CITRATE (PF) 100 MCG/2ML IJ SOLN
INTRAMUSCULAR | Status: AC | PRN
  Administered 2022-02-11 (×2): 50 ug via INTRAVENOUS

## 2022-02-11 MED ORDER — MIDAZOLAM HCL 2 MG/2ML IJ SOLN
INTRAMUSCULAR | Status: AC
  Filled 2022-02-11: qty 2

## 2022-02-11 MED ORDER — LIDOCAINE-EPINEPHRINE 1 %-1:100000 IJ SOLN
INTRAMUSCULAR | Status: AC
  Administered 2022-02-11: 15 mL
  Filled 2022-02-11: qty 1

## 2022-02-11 MED ORDER — MIDAZOLAM HCL 5 MG/5ML IJ SOLN
INTRAMUSCULAR | Status: AC | PRN
  Administered 2022-02-11 (×2): 1 mg via INTRAVENOUS

## 2022-02-11 NOTE — H&P (Signed)
Chief Complaint: Patient was seen in consultation today for anal squamous cell carcinoma Referring Physician(s): Yu,Zhou  Supervising Physician: Juliet Rude  Patient Status: [facility] - Out-pt  History of Present Illness: Cheryl Goodwin is a 61 y.o. female with PMH significant for depression, GERD, and hyperlipidemia being seen today for image-guided port placement. The patient is being followed by Dr Tasia Catchings for newly diagnosed anal squamous cell carcinoma and is set to begin chemotherapy. Patient was referred to IR for image-guided port placement.  Past Medical History:  Diagnosis Date   Depression    GERD (gastroesophageal reflux disease)    Hyperlipidemia     Past Surgical History:  Procedure Laterality Date   AUGMENTATION MAMMAPLASTY Bilateral 2005   with lift   BACK SURGERY  09/24/2014   L1 fracture, [facility]   BREAST ENHANCEMENT SURGERY  2007   COLONOSCOPY  2013   ENDOMETRIAL ABLATION  2013   ESOPHAGOGASTRODUODENOSCOPY (EGD) WITH PROPOFOL N/A 07/08/2016   Procedure: ESOPHAGOGASTRODUODENOSCOPY (EGD) WITH PROPOFOL;  Surgeon: Lucilla Lame, MD;  Location: [facility];  Service: Endoscopy;  Laterality: N/A;   IR IMAGING GUIDED PORT INSERTION  02/11/2022   STOMACH SURGERY  2006   "Tummy Tuck"   TUBAL LIGATION      Allergies: Patient has no known allergies.  Medications: Prior to Admission medications   Medication Sig Start Date End Date Taking? Authorizing Provider  buPROPion (WELLBUTRIN XL) 300 MG 24 hr tablet Take 300 mg by mouth daily.    [provider]  lidocaine-prilocaine (EMLA) cream Apply to affected area once 02/06/22   Earlie Server, MD  omeprazole (PRILOSEC) 40 MG capsule Take 1 capsule (40 mg total) by mouth daily. 07/19/16   Lucilla Lame, MD  ondansetron (ZOFRAN) 8 MG tablet Take 1 tablet (8 mg total) by mouth every 8 (eight) hours as needed for nausea or vomiting. 02/06/22   Earlie Server, MD  prochlorperazine (COMPAZINE) 10 MG tablet Take 1 tablet (10 mg  total) by mouth every 6 (six) hours as needed for nausea or vomiting. 02/06/22   Earlie Server, MD  sertraline (ZOLOFT) 100 MG tablet Take 150 mg by mouth daily.    [provider]  simvastatin (ZOCOR) 20 MG tablet Take 20 mg by mouth every evening.    [provider]     Family History  Problem Relation Age of Onset   Stroke Mother    Breast cancer Mother    Emphysema Father        was a smoker   COPD Sister        was a smoker   Cancer Brother     Social History   Socioeconomic History   Marital status: Widowed    Spouse name: Not on file   Number of children: 2   Years of education: Not on file   Highest education level: Not on file  Occupational History   Occupation: Unemployed  Tobacco Use   Smoking status: Never   Smokeless tobacco: Never  Substance and Sexual Activity   Alcohol use: Yes    Comment: 10 beers/month   Drug use: No   Sexual activity: Not on file  Other Topics Concern   Not on file  Social History Narrative   Not on file   Social Determinants of Health   Financial Resource Strain: Not on file  Food Insecurity: Not on file  Transportation Needs: Not on file  Physical Activity: Not on file  Stress: Not on file  Social Connections:  Not on file     Review of Systems: A 12 point ROS discussed and pertinent positives are indicated in the HPI above.  All other systems are negative.  Review of Systems  Constitutional:  Negative for chills and fever.  Respiratory:  Negative for chest tightness and shortness of breath.   Cardiovascular:  Negative for chest pain and leg swelling.  Gastrointestinal:  Negative for diarrhea, nausea and vomiting.  Neurological:  Negative for dizziness and headaches.  Psychiatric/Behavioral:  Negative for confusion.     Vital Signs: BP (!) 96/53   Pulse 80   Temp (!) 97.3 F (36.3 C) (Oral)   Resp 16   Ht '5\' 3"'$  (1.6 m)   Wt 150 lb (68 kg)   SpO2 96%   BMI 26.57 kg/m     Physical Exam Vitals  reviewed.  Constitutional:      General: She is not in acute distress. HENT:     Mouth/Throat:     Mouth: Mucous membranes are moist.  Cardiovascular:     Rate and Rhythm: Normal rate and regular rhythm.     Pulses: Normal pulses.     Heart sounds: Normal heart sounds.  Pulmonary:     Effort: Pulmonary effort is normal.     Breath sounds: Normal breath sounds.  Abdominal:     General: Bowel sounds are normal.     Palpations: Abdomen is soft.     Tenderness: There is no abdominal tenderness.  Musculoskeletal:     Right lower leg: No edema.     Left lower leg: No edema.  Skin:    General: Skin is warm and dry.  Neurological:     Mental Status: She is alert and oriented to person, place, and time.  Psychiatric:        Mood and Affect: Mood normal.        Behavior: Behavior normal.     Imaging: IR IMAGING GUIDED PORT INSERTION  Result Date: 02/11/2022 INDICATION: Chemotherapy access EXAM: Chest port placement using ultrasound and fluoroscopic guidance MEDICATIONS: Documented in the EMR ANESTHESIA/SEDATION: Moderate (conscious) sedation was employed during this procedure. A total of Versed 2 mg and Fentanyl 100 mcg was administered intravenously. Moderate Sedation Time: 20 minutes. The patient's level of consciousness and vital signs were monitored continuously by radiology nursing throughout the procedure under my direct supervision. FLUOROSCOPY TIME:  Fluoroscopy Time: 0.8 minutes (6 mGy) COMPLICATIONS: None immediate. PROCEDURE: Informed written consent was obtained from the patient after a thorough discussion of the procedural risks, benefits and alternatives. All questions were addressed. Maximal Sterile Barrier Technique was utilized including caps, mask, sterile gowns, sterile gloves, sterile drape, hand hygiene and skin antiseptic. A timeout was performed prior to the initiation of the procedure. The patient was placed supine on the exam table. The right neck and chest was  prepped and draped in the standard sterile fashion. A preliminary ultrasound of the right neck was performed and demonstrates a patent right internal jugular vein. A permanent ultrasound image was stored in the electronic medical record. The overlying skin was anesthetized with 1% Lidocaine. Using ultrasound guidance, access was obtained into the right internal jugular vein using a 21 gauge micropuncture set. A wire was advanced into the SVC, a short incision was made at the puncture site, and serial dilatation performed. Next, in an ipsilateral infraclavicular location, an incision was made at the site of the subcutaneous reservoir. Blunt dissection was used to open a pocket to contain the reservoir. A  subcutaneous tunnel was then created from the port site to the puncture site. A(n) 8 Fr single lumen catheter was advanced through the tunnel. The catheter was attached to the port and this was placed in the subcutaneous pocket. Under fluoroscopic guidance, a peel away sheath was placed, and the catheter was trimmed to the appropriate length and was advanced into the central veins. The catheter length is 21 cm. The tip of the catheter lies near the superior cavoatrial junction. The port flushes and aspirates appropriately. The port was flushed and locked with heparinized saline. The port pocket was closed in 2 layers using 3-0 and 4-0 Vicryl/absorbable suture. Dermabond was also applied to both incisions. The patient tolerated the procedure well and was transferred to recovery in stable condition. IMPRESSION: Successful placement of right-sided chest port via the right internal jugular vein. The port is ready for immediate use. Electronically Signed   By: Albin Felling M.D.   On: 02/11/2022 14:51   MR Pelvis Wo Contrast  Result Date: 02/03/2022 CLINICAL DATA:  History of anal-rectal cancer. EXAM: MRI PELVIS WITHOUT CONTRAST TECHNIQUE: Multiplanar multisequence MR imaging of the pelvis was performed. No  intravenous contrast was administered. COMPARISON:  January 29, 2022 CT of the chest and abdomen. FINDINGS: Urinary Tract:  Unremarkable. Bowel: Signs of anorectal mass involving the low rectum and extending into the anal canal and involving the anal sphincter. Mass is eccentric favoring the LEFT distal rectum and anus. On image 23/8 there is extension beyond the muscular layer of the anus into the internal sphincter and into the intersphincteric plane. This is present greatest from the 12 o'clock to 2 o'clock position though also with scattered areas of involvement in the upper and or canal at the 3 o'clock and 5 o'clock positions. Areas associated with restricted diffusion. Tumor tracks nearly entirely through the anal canal with less invasive features seen along the inferior aspect of the anal canal as compared to the upper inguinal canal. Internal portion of the tumor is anterior and LEFT lateral. More bulky area of tumor is at the anorectal junction (image 18/8 where tumor within the rectum measures approximately 2.2 x 1.5 cm. Tumor in total involving approximately 4 cm length of the distal rectum and anal canal. On sagittal images there is some loss of the fat plane anterior to the rectum between rectum and vagina (image 22/2.) tumor likely abuts the lower vagina, at least extending along the lower margin of the rectovaginal plain into the fat plane between these viscera. Vascular/Lymphatic: Irregular LEFT inguinal lymph node measuring 13 mm with mixed T2 signal and restricted diffusion suspicious for nodal involvement from anal cancer. Scattered smaller nodes in the bilateral inguinal regions also with suspicious features more so on the LEFT than the RIGHT high iliac nodes are not assessed on this rectal focused evaluation. There is no vascular dilation. Reproductive: Reproductive structures grossly unremarkable on this anorectal evaluation. Other:  No pelvic ascites. Musculoskeletal: No suspicious bone  lesions identified. IMPRESSION: 1. Anorectal mass extending from the anterior and lateral LEFT low rectum through the anal canal and involving the LEFT sphincter complex to include the intersphincteric plane with abutment and potential early involvement of external sphincter. Also with suspicion for early encroachment upon the lower rectovaginal plane with extension towards the lower vagina. 2. Signs of LEFT inguinal nodal disease. Other small irregular lymph nodes particularly on the LEFT suspicious with a single necrotic appearing node that is highly suspicious for if not diagnostic of inguinal nodal involvement. Note  that the full pelvis is not assessed on this anorectal focused evaluation. No adenopathy seen along the pelvic sidewalls or about the mesorectum. Electronically Signed   By: Zetta Bills M.D.   On: 02/03/2022 09:15   CT Chest W Contrast  Result Date: 01/29/2022 CLINICAL DATA:  Newly diagnosed anal squamous cell carcinoma. Staging evaluation. * Tracking Code: BO * EXAM: CT CHEST AND ABDOMEN WITH CONTRAST TECHNIQUE: Multidetector CT imaging of the chest and abdomen was performed following the standard protocol during bolus administration of intravenous contrast. RADIATION DOSE REDUCTION: This exam was performed according to the departmental dose-optimization program which includes automated exposure control, adjustment of the mA and/or kV according to patient size and/or use of iterative reconstruction technique. CONTRAST:  192m OMNIPAQUE IOHEXOL 300 MG/ML  SOLN COMPARISON:  None Available. FINDINGS: CT CHEST FINDINGS Cardiovascular: Normal heart size. No significant pericardial effusion/thickening. Great vessels are normal in course and caliber. No central pulmonary emboli. Mediastinum/Nodes: No significant thyroid nodules. Unremarkable esophagus. No pathologically enlarged axillary, mediastinal or hilar lymph nodes. Lungs/Pleura: No pneumothorax. No pleural effusion. Mild patchy consolidation  in the posterior right upper lobe with mild to moderate patchy ground-glass opacities in the right upper, right middle and right lower lobes. Indistinct peripheral right lower lobe 0.5 cm solid pulmonary nodule (series 3/image 90) and solid 0.4 cm peripheral basilar left lower lobe pulmonary nodule (series 3/image 115). Musculoskeletal: No aggressive appearing focal osseous lesions. Bilateral posterior spinal fusion hardware extending from T11 to L3. Mild thoracic spondylosis. Bilateral breast prostheses noted. CT ABDOMEN FINDINGS Hepatobiliary: Normal liver with no liver mass. Normal gallbladder with no radiopaque cholelithiasis. No biliary ductal dilatation. Pancreas: Normal, with no mass or duct dilation. Spleen: Normal size. No mass. Adrenals/Urinary Tract: Normal adrenals. Normal kidneys with no hydronephrosis and no renal mass. Stomach/Bowel: Moderate hiatal hernia. Otherwise normal nondistended stomach. No dilated or thick-walled small or large bowel loops in the abdomen. Oral contrast transits to the left colon. Vascular/Lymphatic: Normal caliber abdominal aorta. Patent portal, splenic, hepatic and renal veins. No pathologically enlarged lymph nodes in the abdomen or pelvis. Other: No pneumoperitoneum, ascites or focal fluid collection. Musculoskeletal: No aggressive appearing focal osseous lesions. Moderate to severe L1 vertebral compression fracture, chronic appearing. Mild lumbar spondylosis. IMPRESSION: 1. No evidence of metastatic disease in the abdomen. 2. No definite findings of metastatic disease in the chest. Two solid pulmonary nodules, largest 0.5 cm in the right lower lobe. Recommend attention on follow-up chest CT in 3 months. 3. Mild patchy consolidation in the posterior right upper lobe with mild to moderate patchy ground-glass opacities in the right upper, right middle and right lower lobes, most suggestive of a mild multilobar pneumonia. 4. Moderate hiatal hernia. 5. Moderate to severe L1  vertebral compression fracture, chronic appearing. These results will be called to the ordering clinician or representative by the Radiologist Assistant, and communication documented in the PACS or [employer] Electronically Signed   By: JIlona SorrelM.D.   On: 01/29/2022 19:10   CT Abdomen W Contrast  Result Date: 01/29/2022 CLINICAL DATA:  Newly diagnosed anal squamous cell carcinoma. Staging evaluation. * Tracking Code: BO * EXAM: CT CHEST AND ABDOMEN WITH CONTRAST TECHNIQUE: Multidetector CT imaging of the chest and abdomen was performed following the standard protocol during bolus administration of intravenous contrast. RADIATION DOSE REDUCTION: This exam was performed according to the departmental dose-optimization program which includes automated exposure control, adjustment of the mA and/or kV according to patient size and/or use of iterative reconstruction technique.  CONTRAST:  161m OMNIPAQUE IOHEXOL 300 MG/ML  SOLN COMPARISON:  None Available. FINDINGS: CT CHEST FINDINGS Cardiovascular: Normal heart size. No significant pericardial effusion/thickening. Great vessels are normal in course and caliber. No central pulmonary emboli. Mediastinum/Nodes: No significant thyroid nodules. Unremarkable esophagus. No pathologically enlarged axillary, mediastinal or hilar lymph nodes. Lungs/Pleura: No pneumothorax. No pleural effusion. Mild patchy consolidation in the posterior right upper lobe with mild to moderate patchy ground-glass opacities in the right upper, right middle and right lower lobes. Indistinct peripheral right lower lobe 0.5 cm solid pulmonary nodule (series 3/image 90) and solid 0.4 cm peripheral basilar left lower lobe pulmonary nodule (series 3/image 115). Musculoskeletal: No aggressive appearing focal osseous lesions. Bilateral posterior spinal fusion hardware extending from T11 to L3. Mild thoracic spondylosis. Bilateral breast prostheses noted. CT ABDOMEN FINDINGS Hepatobiliary: Normal  liver with no liver mass. Normal gallbladder with no radiopaque cholelithiasis. No biliary ductal dilatation. Pancreas: Normal, with no mass or duct dilation. Spleen: Normal size. No mass. Adrenals/Urinary Tract: Normal adrenals. Normal kidneys with no hydronephrosis and no renal mass. Stomach/Bowel: Moderate hiatal hernia. Otherwise normal nondistended stomach. No dilated or thick-walled small or large bowel loops in the abdomen. Oral contrast transits to the left colon. Vascular/Lymphatic: Normal caliber abdominal aorta. Patent portal, splenic, hepatic and renal veins. No pathologically enlarged lymph nodes in the abdomen or pelvis. Other: No pneumoperitoneum, ascites or focal fluid collection. Musculoskeletal: No aggressive appearing focal osseous lesions. Moderate to severe L1 vertebral compression fracture, chronic appearing. Mild lumbar spondylosis. IMPRESSION: 1. No evidence of metastatic disease in the abdomen. 2. No definite findings of metastatic disease in the chest. Two solid pulmonary nodules, largest 0.5 cm in the right lower lobe. Recommend attention on follow-up chest CT in 3 months. 3. Mild patchy consolidation in the posterior right upper lobe with mild to moderate patchy ground-glass opacities in the right upper, right middle and right lower lobes, most suggestive of a mild multilobar pneumonia. 4. Moderate hiatal hernia. 5. Moderate to severe L1 vertebral compression fracture, chronic appearing. These results will be called to the ordering clinician or representative by the Radiologist Assistant, and communication documented in the PACS or [employer] Electronically Signed   By: JIlona SorrelM.D.   On: 01/29/2022 19:10    Labs:  CBC: Recent Labs    01/26/22 1614  WBC 7.2  HGB 11.3*  HCT 34.1*  PLT 399    COAGS: No results for input(s): "INR", "APTT" in the last 8760 hours.  BMP: Recent Labs    01/26/22 1614  NA 131*  K 4.0  CL 99  CO2 22  GLUCOSE 88  BUN 10   CALCIUM 9.3  CREATININE 0.80  GFRNONAA >60    LIVER FUNCTION TESTS: Recent Labs    01/26/22 1614  BILITOT 0.5  AST 24  ALT 17  ALKPHOS 46  PROT 8.6*  ALBUMIN 4.6    TUMOR MARKERS: No results for input(s): "AFPTM", "CEA", "CA199", "CHROMGRNA" in the last 8760 hours.  Assessment and Plan:  SRemell Giaimois a 61yo female being seen today for image-guided port placement secondary to anal squamous cell carcinoma. The patient was recently diagnosed with the above, and is set to begin chemotherapy. The case has been reviewed with Dr EDenna Haggardand is set to proceed on 02/11/22.  Risks and benefits of image guided port-a-catheter placement was discussed with the patient including, but not limited to bleeding, infection, pneumothorax, or fibrin sheath development and need for additional procedures.  All of  the patient's questions were answered, patient is agreeable to proceed. Consent signed and in chart.   Thank you for this interesting consult.  I greatly enjoyed [place] and look forward to participating in their care.  A copy of this report was sent to the requesting provider on this date.  Electronically Signed: Lura Em, PA-C 02/11/2022, 3:45 PM   I spent a total of  40 Minutes   in face to face in clinical consultation, greater than 50% of which was counseling/coordinating care for anal squamous cell carcinoma.

## 2022-02-11 NOTE — Progress Notes (Signed)
Patient clinically stable post Port placement per Dr Denna Haggard, tolerated well. Vitals stable pre and post procedure. Received Versed 2 mg along with Fentanyl 100 mcg IV for procedure. Report given to Darrow Bussing RN/332.

## 2022-02-11 NOTE — Procedures (Signed)
Interventional Radiology Procedure Note  Date of Procedure: 02/11/2022  Procedure: Port placement   Findings:  1. Placement of right chest port    Complications: No immediate complications noted.   Estimated Blood Loss: minimal  Follow-up and Recommendations: 1. Port is ready for use    Albin Felling, MD  Vascular & Interventional Radiology  02/11/2022 2:26 PM

## 2022-02-15 ENCOUNTER — Ambulatory Visit
Admission: RE | Admit: 2022-02-15 | Discharge: 2022-02-15 | Disposition: A | Discharge status: Home / Self Care | Payer: Medicaid Other | Source: Ambulatory Visit | Attending: Radiation Oncology | Admitting: Radiation Oncology

## 2022-02-15 ENCOUNTER — Encounter: Payer: Self-pay | Admitting: Radiation Oncology

## 2022-02-15 ENCOUNTER — Other Ambulatory Visit: Payer: Self-pay | Admitting: *Deleted

## 2022-02-15 VITALS — BP 112/76 | HR 76 | Temp 98.9°F | Resp 16 | Ht 63.0 in | Wt 154.1 lb

## 2022-02-15 DIAGNOSIS — Z79899 Other long term (current) drug therapy: Secondary | ICD-10-CM | POA: Insufficient documentation

## 2022-02-15 DIAGNOSIS — F32A Depression, unspecified: Secondary | ICD-10-CM | POA: Insufficient documentation

## 2022-02-15 DIAGNOSIS — E785 Hyperlipidemia, unspecified: Secondary | ICD-10-CM | POA: Diagnosis not present

## 2022-02-15 DIAGNOSIS — Z510 Encounter for antineoplastic radiation therapy: Secondary | ICD-10-CM | POA: Diagnosis present

## 2022-02-15 DIAGNOSIS — K219 Gastro-esophageal reflux disease without esophagitis: Secondary | ICD-10-CM | POA: Diagnosis not present

## 2022-02-15 DIAGNOSIS — C210 Malignant neoplasm of anus, unspecified: Secondary | ICD-10-CM | POA: Diagnosis present

## 2022-02-15 NOTE — Consult Note (Signed)
NEW PATIENT EVALUATION  Name: Cheryl Goodwin  MRN: [id number]  Date:   02/15/2022     DOB: 1960-07-21   This 61 y.o. 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  REFERRING PHYSICIAN: Sofie Hartigan, MD  CHIEF COMPLAINT:  Chief Complaint  Patient presents with   Anal cancer    DIAGNOSIS: The encounter diagnosis was Anal squamous cell carcinoma ([place]).   PREVIOUS INVESTIGATIONS:  MRI scan reviewed PET scan ordered Pathology report reviewed Clinical notes reviewed  HPI: Patient is a 61 year old female who presents with a self palpable rectal anal mass.  Colonoscopy showed an anal mass at the anal verge.  Initial biopsies were positive for keratinizing moderately to poorly differentiated squamous cell carcinoma.  MRI of her pelvis showed an anal rectal mass extending from the anterior and lateral left low rectum through the anal canal involving the left sphincter including the intersphincteric plane with abutment and potential early involvement of external sphincter.  There is also suspicion for encroachment on the lower rectovaginal plane with extension towards the lower vagina.  There is also signs of left inguinal nodal disease.  She continues to have some's pain and a sense that the lesion is growing.  She has been referred to medical oncology with recommendation for concurrent 5-FU Mitomycin-C and radiation therapy.  PLANNED TREATMENT REGIMEN: Concurrent chemoradiation  PAST MEDICAL HISTORY:  has a past medical history of Depression, GERD (gastroesophageal reflux disease), and Hyperlipidemia.    PAST SURGICAL HISTORY:  Past Surgical History:  Procedure Laterality Date   AUGMENTATION MAMMAPLASTY Bilateral 2005   with lift   BACK SURGERY  09/24/2014   L1 fracture, [facility]   BREAST ENHANCEMENT SURGERY  2007   COLONOSCOPY  2013   ENDOMETRIAL ABLATION  2013   ESOPHAGOGASTRODUODENOSCOPY (EGD) WITH  PROPOFOL N/A 07/08/2016   Procedure: ESOPHAGOGASTRODUODENOSCOPY (EGD) WITH PROPOFOL;  Surgeon: Lucilla Lame, MD;  Location: [facility];  Service: Endoscopy;  Laterality: N/A;   IR IMAGING GUIDED PORT INSERTION  02/11/2022   STOMACH SURGERY  2006   "Tummy Tuck"   TUBAL LIGATION      FAMILY HISTORY: family history includes Breast cancer in her mother; COPD in her sister; Cancer in her brother; Emphysema in her father; Stroke in her mother.  SOCIAL HISTORY:  reports that she has never smoked. She has never used smokeless tobacco. She reports current alcohol use. She reports that she does not use drugs.  ALLERGIES: Patient has no known allergies.  MEDICATIONS:  Current Outpatient Medications  Medication Sig Dispense Refill   buPROPion (WELLBUTRIN XL) 300 MG 24 hr tablet Take 300 mg by mouth daily.     lidocaine-prilocaine (EMLA) cream Apply to affected area once 30 g 3   omeprazole (PRILOSEC) 40 MG capsule Take 1 capsule (40 mg total) by mouth daily. 30 capsule 3   ondansetron (ZOFRAN) 8 MG tablet Take 1 tablet (8 mg total) by mouth every 8 (eight) hours as needed for nausea or vomiting. 30 tablet 1   prochlorperazine (COMPAZINE) 10 MG tablet Take 1 tablet (10 mg total) by mouth every 6 (six) hours as needed for nausea or vomiting. 30 tablet 1   sertraline (ZOLOFT) 100 MG tablet Take 150 mg by mouth daily.     simvastatin (ZOCOR) 20 MG tablet Take 20 mg by mouth every evening.     No current facility-administered medications for this encounter.    ECOG PERFORMANCE STATUS:  1 - Symptomatic but completely ambulatory  REVIEW OF SYSTEMS: Patient denies any weight loss, fatigue, weakness, fever, chills or night sweats. Patient denies any loss of vision, blurred vision. Patient denies any ringing  of the ears or hearing loss. No irregular heartbeat. Patient denies heart murmur or history of fainting. Patient denies any chest pain or pain radiating to her upper extremities. Patient  denies any shortness of breath, difficulty breathing at night, cough or hemoptysis. Patient denies any swelling in the lower legs. Patient denies any nausea vomiting, vomiting of blood, or coffee ground material in the vomitus. Patient denies any stomach pain. Patient states has had normal bowel movements no significant constipation or diarrhea. Patient denies any dysuria, hematuria or significant nocturia. Patient denies any problems walking, swelling in the joints or loss of balance. Patient denies any skin changes, loss of hair or loss of weight. Patient denies any excessive worrying or anxiety or significant depression. Patient denies any problems with insomnia. Patient denies excessive thirst, polyuria, polydipsia. Patient denies any swollen glands, patient denies easy bruising or easy bleeding. Patient denies any recent infections, allergies or URI. Patient "s visual fields have not changed significantly in recent time.   PHYSICAL EXAM: BP 112/76 (BP Location: Right Arm, Patient Position: Sitting, Cuff Size: Normal)   Pulse 76   Temp 98.9 F (37.2 C) (Tympanic)   Resp 16   Ht '5\' 3"'$  (1.6 m) Comment: stated ht  Wt 154 lb 1.6 oz (69.9 kg)   BMI 27.30 kg/m  Well-developed well-nourished patient in NAD. HEENT reveals PERLA, EOMI, discs not visualized.  Oral cavity is clear. No oral mucosal lesions are identified. Neck is clear without evidence of cervical or supraclavicular adenopathy. Lungs are clear to A&P. Cardiac examination is essentially unremarkable with regular rate and rhythm without murmur rub or thrill. Abdomen is benign with no organomegaly or masses noted. Motor sensory and DTR levels are equal and symmetric in the upper and lower extremities. Cranial nerves II through XII are grossly intact. Proprioception is intact. No peripheral adenopathy or edema is identified. No motor or sensory levels are noted. Crude visual fields are within normal range.  LABORATORY DATA: Pathology reports  reviewed    RADIOLOGY RESULTS: MRI scan of pelvis reviewed compatible with above-stated findings.  PET scan ordered   IMPRESSION: Stage IIIc squamous cell carcinoma of the anus in 61 year old female  PLAN: At the present time I ordered a PET scan to better delineate tumor extent as well as nodal involvement.  Would plan on delivering 54 Grayover 5 to 6 weeks with concurrent chemotherapy.  Will treat her involved pelvic nodes to 50 Gray in uninvolved nodes to 45 Gray using IMRT dose painting technique.  Risks and benefits of treatment including increased lower urinary tract symptoms diarrhea fatigue alteration blood counts skin reaction all were reviewed in detail with the patient.  We will set up simulation shortly after PET scan is performed.  We will coordinate chemotherapy with medical oncology.  There will be extra effort by both professional staff as well as technical staff to coordinate and manage concurrent chemoradiation and ensuing side effects during his treatments. Patient comprehends my treatment plan well.  I would like to take this opportunity to thank you for allowing me to participate in the care of your patient.Noreene Filbert, MD

## 2022-02-16 ENCOUNTER — Other Ambulatory Visit: Payer: Self-pay

## 2022-02-16 ENCOUNTER — Encounter: Payer: Self-pay | Admitting: Oncology

## 2022-02-16 NOTE — Telephone Encounter (Signed)
Waiting on radiation schedule to schedule treatment. Will check with radiation.

## 2022-02-17 ENCOUNTER — Other Ambulatory Visit: Payer: Self-pay

## 2022-02-17 NOTE — Progress Notes (Signed)
The proposed treatment discussed in conference is for discussion purpose only and is not a binding recommendation.  The patients have not been physically examined, or presented with their treatment options.  Therefore, final treatment plans cannot be decided.  

## 2022-02-18 ENCOUNTER — Other Ambulatory Visit: Payer: Self-pay | Admitting: Oncology

## 2022-02-18 ENCOUNTER — Encounter: Payer: Self-pay | Admitting: Oncology

## 2022-02-18 DIAGNOSIS — C210 Malignant neoplasm of anus, unspecified: Secondary | ICD-10-CM

## 2022-02-19 ENCOUNTER — Other Ambulatory Visit: Payer: Self-pay

## 2022-02-23 ENCOUNTER — Ambulatory Visit
Admission: RE | Admit: 2022-02-23 | Discharge: 2022-02-23 | Disposition: A | Discharge status: Home / Self Care | Payer: Medicaid Other | Source: Ambulatory Visit | Attending: Radiation Oncology | Admitting: Radiation Oncology

## 2022-02-23 ENCOUNTER — Ambulatory Visit: Payer: Medicaid Other

## 2022-02-23 DIAGNOSIS — Z510 Encounter for antineoplastic radiation therapy: Secondary | ICD-10-CM | POA: Diagnosis not present

## 2022-02-25 ENCOUNTER — Other Ambulatory Visit: Payer: Self-pay | Admitting: Oncology

## 2022-02-26 ENCOUNTER — Ambulatory Visit: Payer: Medicaid Other

## 2022-02-26 ENCOUNTER — Other Ambulatory Visit: Payer: Medicaid Other

## 2022-03-03 ENCOUNTER — Ambulatory Visit: Payer: Medicaid Other

## 2022-03-04 DIAGNOSIS — Z79899 Other long term (current) drug therapy: Secondary | ICD-10-CM | POA: Diagnosis not present

## 2022-03-04 DIAGNOSIS — F32A Depression, unspecified: Secondary | ICD-10-CM | POA: Diagnosis not present

## 2022-03-04 DIAGNOSIS — Z510 Encounter for antineoplastic radiation therapy: Secondary | ICD-10-CM | POA: Insufficient documentation

## 2022-03-04 DIAGNOSIS — C210 Malignant neoplasm of anus, unspecified: Secondary | ICD-10-CM | POA: Insufficient documentation

## 2022-03-04 DIAGNOSIS — E785 Hyperlipidemia, unspecified: Secondary | ICD-10-CM | POA: Diagnosis not present

## 2022-03-04 DIAGNOSIS — K219 Gastro-esophageal reflux disease without esophagitis: Secondary | ICD-10-CM | POA: Diagnosis not present

## 2022-03-08 ENCOUNTER — Inpatient Hospital Stay: Payer: Medicaid Other | Attending: Oncology | Admitting: Oncology

## 2022-03-08 ENCOUNTER — Inpatient Hospital Stay: Payer: Medicaid Other | Attending: Oncology

## 2022-03-08 ENCOUNTER — Inpatient Hospital Stay: Payer: Medicaid Other

## 2022-03-08 ENCOUNTER — Other Ambulatory Visit: Payer: Self-pay

## 2022-03-08 ENCOUNTER — Ambulatory Visit: Admission: RE | Admit: 2022-03-08 | Payer: Medicaid Other | Source: Ambulatory Visit

## 2022-03-08 ENCOUNTER — Encounter: Payer: Self-pay | Admitting: Oncology

## 2022-03-08 VITALS — BP 140/89 | HR 79 | Resp 18 | Ht 63.0 in | Wt 152.0 lb

## 2022-03-08 DIAGNOSIS — D509 Iron deficiency anemia, unspecified: Secondary | ICD-10-CM | POA: Insufficient documentation

## 2022-03-08 DIAGNOSIS — Z79899 Other long term (current) drug therapy: Secondary | ICD-10-CM | POA: Insufficient documentation

## 2022-03-08 DIAGNOSIS — C210 Malignant neoplasm of anus, unspecified: Secondary | ICD-10-CM

## 2022-03-08 DIAGNOSIS — Z510 Encounter for antineoplastic radiation therapy: Secondary | ICD-10-CM | POA: Diagnosis not present

## 2022-03-08 DIAGNOSIS — Z5111 Encounter for antineoplastic chemotherapy: Secondary | ICD-10-CM | POA: Insufficient documentation

## 2022-03-08 DIAGNOSIS — D500 Iron deficiency anemia secondary to blood loss (chronic): Secondary | ICD-10-CM

## 2022-03-08 DIAGNOSIS — R918 Other nonspecific abnormal finding of lung field: Secondary | ICD-10-CM

## 2022-03-08 DIAGNOSIS — G8918 Other acute postprocedural pain: Secondary | ICD-10-CM | POA: Insufficient documentation

## 2022-03-08 LAB — CBC WITH DIFFERENTIAL/PLATELET
Abs Immature Granulocytes: 0.01 10*3/uL (ref 0.00–0.07)
Basophils Absolute: 0.1 10*3/uL (ref 0.0–0.1)
Basophils Relative: 1 %
Eosinophils Absolute: 0.2 10*3/uL (ref 0.0–0.5)
Eosinophils Relative: 4 %
HCT: 32.9 % — ABNORMAL LOW (ref 36.0–46.0)
Hemoglobin: 10.8 g/dL — ABNORMAL LOW (ref 12.0–15.0)
Immature Granulocytes: 0 %
Lymphocytes Relative: 39 %
Lymphs Abs: 2.5 10*3/uL (ref 0.7–4.0)
MCH: 25.1 pg — ABNORMAL LOW (ref 26.0–34.0)
MCHC: 32.8 g/dL (ref 30.0–36.0)
MCV: 76.5 fL — ABNORMAL LOW (ref 80.0–100.0)
Monocytes Absolute: 0.5 10*3/uL (ref 0.1–1.0)
Monocytes Relative: 7 %
Neutro Abs: 3.1 10*3/uL (ref 1.7–7.7)
Neutrophils Relative %: 49 %
Platelets: 376 10*3/uL (ref 150–400)
RBC: 4.3 MIL/uL (ref 3.87–5.11)
RDW: 15.4 % (ref 11.5–15.5)
WBC: 6.4 10*3/uL (ref 4.0–10.5)
nRBC: 0 % (ref 0.0–0.2)

## 2022-03-08 LAB — COMPREHENSIVE METABOLIC PANEL
ALT: 15 U/L (ref 0–44)
AST: 23 U/L (ref 15–41)
Albumin: 4.5 g/dL (ref 3.5–5.0)
Alkaline Phosphatase: 46 U/L (ref 38–126)
Anion gap: 9 (ref 5–15)
BUN: 10 mg/dL (ref 6–20)
CO2: 23 mmol/L (ref 22–32)
Calcium: 8.9 mg/dL (ref 8.9–10.3)
Chloride: 100 mmol/L (ref 98–111)
Creatinine, Ser: 0.8 mg/dL (ref 0.44–1.00)
GFR, Estimated: >60 mL/min (ref >60)
Glucose, Bld: 101 mg/dL — ABNORMAL HIGH (ref 70–99)
Potassium: 3.9 mmol/L (ref 3.5–5.1)
Sodium: 132 mmol/L — ABNORMAL LOW (ref 135–145)
Total Bilirubin: 0.4 mg/dL (ref 0.3–1.2)
Total Protein: 7.8 g/dL (ref 6.5–8.1)

## 2022-03-08 LAB — RETIC PANEL
Immature Retic Fract: 13.1 % (ref 2.3–15.9)
RBC.: 4.26 MIL/uL (ref 3.87–5.11)
Retic Count, Absolute: 58.8 10*3/uL (ref 19.0–186.0)
Retic Ct Pct: 1.4 % (ref 0.4–3.1)
Reticulocyte Hemoglobin: 28 pg (ref >27.9)

## 2022-03-08 LAB — IRON AND TIBC
Iron: 47 ug/dL (ref 28–170)
Saturation Ratios: 10 % — ABNORMAL LOW (ref 10.4–31.8)
TIBC: 491 ug/dL — ABNORMAL HIGH (ref 250–450)
UIBC: 444 ug/dL

## 2022-03-08 LAB — FERRITIN: Ferritin: 8 ng/mL — ABNORMAL LOW (ref 11–307)

## 2022-03-08 MED ORDER — PROCHLORPERAZINE MALEATE 10 MG PO TABS
10.0000 mg | ORAL_TABLET | Freq: Once | ORAL | Status: AC
  Administered 2022-03-08: 10 mg via ORAL
  Filled 2022-03-08: qty 1

## 2022-03-08 MED ORDER — SODIUM CHLORIDE 0.9 % IV SOLN
1000.0000 mg/m2/d | INTRAVENOUS | Status: DC
  Administered 2022-03-08: 7400 mg via INTRAVENOUS
  Filled 2022-03-08: qty 100

## 2022-03-08 MED ORDER — MITOMYCIN CHEMO IV INJECTION 20 MG
18.0000 mg | Freq: Once | INTRAVENOUS | Status: AC
  Administered 2022-03-08: 18 mg via INTRAVENOUS
  Filled 2022-03-08: qty 36

## 2022-03-08 MED ORDER — SODIUM CHLORIDE 0.9 % IV SOLN
Freq: Once | INTRAVENOUS | Status: AC
  Filled 2022-03-08: qty 250

## 2022-03-08 NOTE — Progress Notes (Signed)
Hematology/Oncology Consult Note [phone] [phone]     ASSESSMENT & PLAN:   Cancer Staging  Anal squamous cell carcinoma ([place]) Staging form: Anus, AJCC V9 - Clinical stage from 01/26/2022: Stage IIIC (cT4, cN1, cM0) - Signed by Earlie Server, MD on 02/06/2022   Anal squamous cell carcinoma [facility]) Pathology and images were reviewed and discussed with patient. 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  Discussed with surgery Dr.White.  Labs are reviewed and discussed with patient. Proceed with D1 5-FU/mitomycin C  Follow up with radiation oncology for concurrent radiation.   Lung nodules Non specific. Attention on follow up  CT also showed patchy consolidation of right lung, clinically patient denies any sob, chest pain or cough.   IDA (iron deficiency anemia) Check iron panel- consistent with iron deficiency anemia.  I discussed option of IV Venofer treatments. Oral iron supplementation is not preferred due to possible GI side effect to complicate her chemoradiation course.  I discussed about the potential risks including but not limited to allergic reactions/infusion reactions including anaphylactic reactions, phlebitis, high blood pressure, wheezing, SOB, skin rash, weight gain,dark urine, leg swelling, back pain, headache, nausea and fatigue, etc. Patient agrees with the plan Plan IV venofer weekly x 4    Orders Placed This Encounter  Procedures   Iron and TIBC    Standing Status:   Future    Number of Occurrences:   1    Standing Expiration Date:   03/09/2023   Ferritin    Standing Status:   Future    Number of Occurrences:   1    Standing Expiration Date:   03/09/2023   Retic Panel    Standing Status:   Future    Number of Occurrences:   1    Standing Expiration Date:   03/09/2023   CBC with Differential/Platelet    Standing Status:   Future     Standing Expiration Date:   03/09/2023   Comprehensive metabolic panel    Standing Status:   Future    Standing Expiration Date:   03/08/2023   Follow up  Weekly venofer x 4.  Lab MD 10 days.   All questions were answered. The patient knows to call the clinic with any problems, questions or concerns.  Earlie Server, MD, PhD [facility] Hematology Oncology 03/08/2022     CHIEF COMPLAINTS/PURPOSE OF CONSULTATION:  Anal squamous carcinoma  HISTORY OF PRESENTING ILLNESS:  Cheryl Goodwin 62 y.o. female presents to establish care for Anal squamous carcinoma I have reviewed her chart and materials related to her cancer extensively and collaborated history with the patient. Summary of oncologic history is as follows: Oncology History  Anal squamous cell carcinoma ([place])  01/18/2022 Procedure   Patient self palpated rectal/anal mass.  Denies any difficulty passing bowel movements, blood in the stool.  01/18/2022, colonoscopy showed anal mass 0 to 1 cm from the anal verge.  Likely malignant tumor in the distal rectum, biopsied.  Nonbleeding internal hemorrhoids.   01/26/2022 Initial Diagnosis   Anal squamous cell carcinoma ([place])  -Biopsy of the distal rectum mass showed keratinizing moderately to poorly differentiated squamous cell carcinoma at least in situ.  Superficial biopsies, invasion cannot be accessed.   01/26/2022 Cancer Staging   Staging form: Anus, AJCC V9 - Clinical stage from 01/26/2022: Stage IIIC (cT4, cN1, cM0) - Signed by Earlie Server, MD on 02/06/2022 Stage prefix: Initial  diagnosis   01/29/2022 Imaging   CT chest abdomen w contrast  1. No evidence of metastatic disease in the abdomen. 2. No definite findings of metastatic disease in the chest. Two solid pulmonary nodules, largest 0.5 cm in the right lower lobe.Recommend attention on follow-up chest CT in 3 months. 3. Mild patchy consolidation in the posterior right upper lobe with mild to moderate patchy ground-glass opacities in  the right upper,right middle and right lower lobes, most suggestive of a mild multilobar pneumonia. 4. Moderate hiatal hernia. 5. Moderate to severe L1 vertebral compression fracture, chronic appearing.   02/03/2022 Imaging   MR pelvis wo contrast 1. Anorectal mass extending from the anterior and lateral LEFT low rectum through the anal canal and involving the LEFT sphincter complex to include the intersphincteric plane with abutment and potential early involvement of external sphincter. Also with suspicion for early encroachment upon the lower rectovaginal plane with extension towards the lower vagina. 2. Signs of LEFT inguinal nodal disease. Other small irregular lymph nodes particularly on the LEFT suspicious with a single necrotic appearing node that is highly suspicious for if not diagnostic of inguinal nodal involvement. Note that the full pelvis is not assessed on this anorectal focused evaluation. No adenopathy seen along the pelvic sidewalls or about the mesorectum.      03/08/2022 -  Chemotherapy   Patient is on Treatment Plan : ANUS Mitomycin D1,28 + 5FU D1-4, 28-31 q32d      Patient is widowed and current not sexually active. Denies smoking or alcohol use. She has an upcoming gynecology appointment.  Denies any abdominal pain, cough, shortness of breath nausea vomiting.  Denies any blood in the stool.  She has established care with Dr.White and was recommended to proceed with concurrent chemotherapy and Radiation.   Today patient reports feeling well. She has been to chemotherapy class and has had medi port placed during the interval.  She has antiemetics available at home.     MEDICAL HISTORY:  Past Medical History:  Diagnosis Date   Depression    GERD (gastroesophageal reflux disease)    Hyperlipidemia     SURGICAL HISTORY: Past Surgical History:  Procedure Laterality Date   AUGMENTATION MAMMAPLASTY Bilateral 2005   with lift   BACK SURGERY  09/24/2014   L1  fracture, [facility]   BREAST ENHANCEMENT SURGERY  2007   COLONOSCOPY  2013   ENDOMETRIAL ABLATION  2013   ESOPHAGOGASTRODUODENOSCOPY (EGD) WITH PROPOFOL N/A 07/08/2016   Procedure: ESOPHAGOGASTRODUODENOSCOPY (EGD) WITH PROPOFOL;  Surgeon: Lucilla Lame, MD;  Location: [facility];  Service: Endoscopy;  Laterality: N/A;   IR IMAGING GUIDED PORT INSERTION  02/11/2022   STOMACH SURGERY  2006   "Tummy Tuck"   TUBAL LIGATION      SOCIAL HISTORY: Social History   Socioeconomic History   Marital status: Widowed    Spouse name: Not on file   Number of children: 2   Years of education: Not on file   Highest education level: Not on file  Occupational History   Occupation: Unemployed  Tobacco Use   Smoking status: Never   Smokeless tobacco: Never  Substance and Sexual Activity   Alcohol use: Yes    Comment: 10 beers/month   Drug use: No   Sexual activity: Not on file  Other Topics Concern   Not on file  Social History Narrative   Not on file   Social Determinants of Health   Financial Resource Strain: Not on file  Food Insecurity:  Not on file  Transportation Needs: Not on file  Physical Activity: Not on file  Stress: Not on file  Social Connections: Not on file  Intimate Partner Violence: Not on file    FAMILY HISTORY: Family History  Problem Relation Age of Onset   Stroke Mother    Breast cancer Mother    Emphysema Father        was a smoker   COPD Sister        was a smoker   Cancer Brother     ALLERGIES:  has No Known Allergies.  MEDICATIONS:  Current Outpatient Medications  Medication Sig Dispense Refill   buPROPion (WELLBUTRIN XL) 300 MG 24 hr tablet Take 300 mg by mouth daily.     esomeprazole (NEXIUM) 40 MG capsule Take by mouth.     lidocaine-prilocaine (EMLA) cream Apply to affected area once 30 g 3   lovastatin (MEVACOR) 20 MG tablet SMARTSIG:1 Tablet(s) By Mouth Every Evening     sertraline (ZOLOFT) 100 MG tablet Take 150 mg by mouth daily.      simvastatin (ZOCOR) 20 MG tablet Take 20 mg by mouth every evening.     omeprazole (PRILOSEC) 40 MG capsule Take 1 capsule (40 mg total) by mouth daily. (Patient not taking: Reported on 03/08/2022) 30 capsule 3   ondansetron (ZOFRAN) 8 MG tablet Take 1 tablet (8 mg total) by mouth every 8 (eight) hours as needed for nausea or vomiting. (Patient not taking: Reported on 03/08/2022) 30 tablet 1   prochlorperazine (COMPAZINE) 10 MG tablet Take 1 tablet (10 mg total) by mouth every 6 (six) hours as needed for nausea or vomiting. (Patient not taking: Reported on 03/08/2022) 30 tablet 1   No current facility-administered medications for this visit.   Facility-Administered Medications Ordered in Other Visits  Medication Dose Route Frequency Provider Last Rate Last Admin   fluorouracil (ADRUCIL) 7,400 mg in sodium chloride 0.9 % 102 mL chemo infusion  1,000 mg/m2/day (Order-Specific) Intravenous 4 days Earlie Server, MD   Infusion Verify at 03/08/22 1630    Review of Systems  Constitutional:  Negative for appetite change, chills, fatigue and fever.  HENT:   Negative for hearing loss and voice change.   Eyes:  Negative for eye problems.  Respiratory:  Negative for chest tightness and cough.   Cardiovascular:  Negative for chest pain.  Gastrointestinal:  Negative for abdominal distention, abdominal pain and blood in stool.  Endocrine: Negative for hot flashes.  Genitourinary:  Negative for difficulty urinating and frequency.   Musculoskeletal:  Negative for arthralgias.  Skin:  Negative for itching and rash.  Neurological:  Negative for extremity weakness.  Hematological:  Negative for adenopathy.  Psychiatric/Behavioral:  Negative for confusion.      PHYSICAL EXAMINATION: ECOG PERFORMANCE STATUS: 0 - Asymptomatic  Vitals:   03/08/22 1455  BP: (!) 140/89  Pulse: 79  Resp: 18  SpO2: 98%   Filed Weights   03/08/22 1452  Weight: 152 lb (68.9 kg)    Physical Exam Constitutional:      General: She  is not in acute distress.    Appearance: She is not diaphoretic.  HENT:     Head: Normocephalic.     Nose: Nose normal.     Mouth/Throat:     Pharynx: No oropharyngeal exudate.  Eyes:     General: No scleral icterus.    Pupils: Pupils are equal, round, and reactive to light.  Cardiovascular:     Rate and Rhythm: Normal  rate.     Heart sounds: No murmur heard. Pulmonary:     Effort: Pulmonary effort is normal. No respiratory distress.     Breath sounds: No wheezing.  Abdominal:     General: There is no distension.     Palpations: Abdomen is soft.     Tenderness: There is no abdominal tenderness.  Musculoskeletal:        General: Normal range of motion.     Cervical back: Normal range of motion and neck supple.  Skin:    General: Skin is warm and dry.     Findings: No erythema.  Neurological:     Mental Status: She is alert and oriented to person, place, and time.     Cranial Nerves: No cranial nerve deficit.     Motor: No abnormal muscle tone.     Coordination: Coordination normal.  Psychiatric:        Mood and Affect: Affect normal.      LABORATORY DATA:  I have reviewed the data as listed    Latest Ref Rng & Units 03/08/2022    2:35 PM 01/26/2022    4:14 PM 11/02/2011    7:51 PM  CBC  WBC 4.0 - 10.5 K/uL 6.4  7.2    Hemoglobin 12.0 - 15.0 g/dL 10.8  11.3  8.2   Hematocrit 36.0 - 46.0 % 32.9  34.1    Platelets 150 - 400 K/uL 376  399        Latest Ref Rng & Units 03/08/2022    2:35 PM 01/26/2022    4:14 PM 10/07/2011    5:28 PM  CMP  Glucose 70 - 99 mg/dL 101  88  111   BUN 6 - 20 mg/dL '10  10  6   '$ Creatinine 0.44 - 1.00 mg/dL 0.80  0.80  0.88   Sodium 135 - 145 mmol/L 132  131  136   Potassium 3.5 - 5.1 mmol/L 3.9  4.0  3.7   Chloride 98 - 111 mmol/L 100  99  103   CO2 22 - 32 mmol/L '23  22  25   '$ Calcium 8.9 - 10.3 mg/dL 8.9  9.3  8.8   Total Protein 6.5 - 8.1 g/dL 7.8  8.6  7.8   Total Bilirubin 0.3 - 1.2 mg/dL 0.4  0.5  0.2   Alkaline Phos 38 - 126 U/L 46   46  40   AST 15 - 41 U/L '23  24  26   '$ ALT 0 - 44 U/L '15  17  19      '$ RADIOGRAPHIC STUDIES: I have personally reviewed the radiological images as listed and agreed with the findings in the report. IR IMAGING GUIDED PORT INSERTION  Result Date: 02/11/2022 INDICATION: Chemotherapy access EXAM: Chest port placement using ultrasound and fluoroscopic guidance MEDICATIONS: Documented in the EMR ANESTHESIA/SEDATION: Moderate (conscious) sedation was employed during this procedure. A total of Versed 2 mg and Fentanyl 100 mcg was administered intravenously. Moderate Sedation Time: 20 minutes. The patient's level of consciousness and vital signs were monitored continuously by radiology nursing throughout the procedure under my direct supervision. FLUOROSCOPY TIME:  Fluoroscopy Time: 0.8 minutes (6 mGy) COMPLICATIONS: None immediate. PROCEDURE: Informed written consent was obtained from the patient after a thorough discussion of the procedural risks, benefits and alternatives. All questions were addressed. Maximal Sterile Barrier Technique was utilized including caps, mask, sterile gowns, sterile gloves, sterile drape, hand hygiene and skin antiseptic. A timeout was performed prior to  the initiation of the procedure. The patient was placed supine on the exam table. The right neck and chest was prepped and draped in the standard sterile fashion. A preliminary ultrasound of the right neck was performed and demonstrates a patent right internal jugular vein. A permanent ultrasound image was stored in the electronic medical record. The overlying skin was anesthetized with 1% Lidocaine. Using ultrasound guidance, access was obtained into the right internal jugular vein using a 21 gauge micropuncture set. A wire was advanced into the SVC, a short incision was made at the puncture site, and serial dilatation performed. Next, in an ipsilateral infraclavicular location, an incision was made at the site of the subcutaneous  reservoir. Blunt dissection was used to open a pocket to contain the reservoir. A subcutaneous tunnel was then created from the port site to the puncture site. A(n) 8 Fr single lumen catheter was advanced through the tunnel. The catheter was attached to the port and this was placed in the subcutaneous pocket. Under fluoroscopic guidance, a peel away sheath was placed, and the catheter was trimmed to the appropriate length and was advanced into the central veins. The catheter length is 21 cm. The tip of the catheter lies near the superior cavoatrial junction. The port flushes and aspirates appropriately. The port was flushed and locked with heparinized saline. The port pocket was closed in 2 layers using 3-0 and 4-0 Vicryl/absorbable suture. Dermabond was also applied to both incisions. The patient tolerated the procedure well and was transferred to recovery in stable condition. IMPRESSION: Successful placement of right-sided chest port via the right internal jugular vein. The port is ready for immediate use. Electronically Signed   By: Albin Felling M.D.   On: 02/11/2022 14:51

## 2022-03-08 NOTE — Patient Instructions (Addendum)
[facility] [place]  Discharge Instructions: Thank you for choosing [facility] to provide your oncology and hematology care.  If you have a lab appointment with the [facility], please go directly to the [facility] and check in at the registration area.  Wear comfortable clothing and clothing appropriate for easy access to any Portacath or PICC line.   We strive to give you quality time with your provider. You may need to reschedule your appointment if you arrive late (15 or more minutes).  Arriving late affects you and other patients whose appointments are after yours.  Also, if you miss three or more appointments without notifying the office, you may be dismissed from the clinic at the provider's discretion.      For prescription refill requests, have your pharmacy contact our office and allow 72 hours for refills to be completed.    Today you received the following chemotherapy and/or immunotherapy agents: Mitomycin, Fluorauracil       To help prevent nausea and vomiting after your treatment, we encourage you to take your nausea medication as directed.  BELOW ARE SYMPTOMS THAT SHOULD BE REPORTED IMMEDIATELY: *FEVER GREATER THAN 100.4 F (38 C) OR HIGHER *CHILLS OR SWEATING *NAUSEA AND VOMITING THAT IS NOT CONTROLLED WITH YOUR NAUSEA MEDICATION *UNUSUAL SHORTNESS OF BREATH *UNUSUAL BRUISING OR BLEEDING *URINARY PROBLEMS (pain or burning when urinating, or frequent urination) *BOWEL PROBLEMS (unusual diarrhea, constipation, pain near the anus) TENDERNESS IN MOUTH AND THROAT WITH OR WITHOUT PRESENCE OF ULCERS (sore throat, sores in mouth, or a toothache) UNUSUAL RASH, SWELLING OR PAIN  UNUSUAL VAGINAL DISCHARGE OR ITCHING   Items with * indicate a potential emergency and should be followed up as soon as possible or go to the Emergency Department if any problems should occur.  Please show the CHEMOTHERAPY ALERT CARD or IMMUNOTHERAPY ALERT CARD  at check-in to the Emergency Department and triage nurse.  Should you have questions after your visit or need to cancel or reschedule your appointment, please contact [facility] [place] AT [place]  [phone] and follow the prompts.  Office hours are 8:00 a.m. to 4:30 p.m. Monday - Friday. Please note that voicemails left after 4:00 p.m. may not be returned until the following business day.  We are closed weekends and major holidays. You have access to a nurse at all times for urgent questions. Please call the main number to the clinic [phone] and follow the prompts.  For any non-urgent questions, you may also contact your provider using MyChart. We now offer e-Visits for anyone 64 and older to request care online for non-urgent symptoms. For details visit [email or web address].   Also download the MyChart app! Go to the app store, search "MyChart", open the app, select , and log in with your MyChart username and password.   Mitomycin Injection What is this medication? MITOMYCIN (mye toe MYE sin) treats stomach cancer and pancreatic cancer. It works by slowing down the growth of cancer cells. This medicine may be used for other purposes; ask your health care provider or pharmacist if you have questions. COMMON BRAND NAME(S): Mutamycin What should I tell my care team before I take this medication? They need to know if you have any of these conditions: Bleeding disorders Infection, such as chickenpox, cold sores, herpes Low blood counts, such as low white cells, platelets, red blood cells Kidney disease An unusual or allergic reaction to mitomycin, other medications, foods, dyes, or preservatives Pregnant or trying to  get pregnant Breastfeeding How should I use this medication? This medication is injected into a vein. It is given by your care team in a hospital or clinic setting. Talk to your care team about the use of this medication in children. Special  care may be needed. Overdosage: If you think you have taken too much of this medicine contact a poison control center or emergency room at once. NOTE: This medicine is only for you. Do not share this medicine with others. What if I miss a dose? Keep appointments for follow-up doses. It is important not to miss your dose. Call your care team if you are unable to keep an appointment. What may interact with this medication? Interactions are not expected. This list may not describe all possible interactions. Give your health care provider a list of all the medicines, herbs, non-prescription drugs, or dietary supplements you use. Also tell them if you smoke, drink alcohol, or use illegal drugs. Some items may interact with your medicine. What should I watch for while using this medication? Your condition will be monitored carefully while you are receiving this medication. You may need blood work while taking this medication. This medication may make you feel generally unwell. This is not uncommon as chemotherapy can affect healthy cells as well as cancer cells. Report any side effects. Continue your course of treatment even though you feel ill unless your care team tells you to stop. This medication may increase your risk of getting an infection. Call your care team for advice if you get a fever, chills, sore throat, or other symptoms of a cold or flu. Do not treat yourself. Try to avoid being around people who are sick. Avoid taking medications that contain aspirin, acetaminophen, ibuprofen, naproxen, or ketoprofen unless instructed by your care team. These medications may hide a fever. This medication may increase your risk to bruise or bleed. Call your care team if you notice any unusual bleeding. Be careful brushing or flossing your teeth or using a toothpick because you may get an infection or bleed more easily. If you have any dental work done, tell your dentist you are receiving this  medication. Talk to your care team if you may be pregnant. Serious birth defects can occur if you take this medication during pregnancy. Contraception is recommended while taking this medication. Your care team can help you find the option that works for you. Do not breastfeed while taking this medication. What side effects may I notice from receiving this medication? Side effects that you should report to your care team as soon as possible: Allergic reactions--skin rash, itching, hives, swelling of the face, lips, tongue, or throat Dry cough, shortness of breath or trouble breathing Infection--fever, chills, cough, sore throat, wounds that don't heal, pain or trouble when passing urine, general feeling of discomfort or being unwell Kidney injury--decrease in the amount of urine, swelling of the ankles, hands, or feet Low red blood cell level--unusual weakness or fatigue, dizziness, headache, trouble breathing Stomach pain, bloody diarrhea, pale skin, unusual weakness or fatigue, decrease in the amount of urine, which may be signs of hemolytic uremic syndrome Unusual bruising or bleeding Side effects that usually do not require medical attention (report these to your care team if they continue or are bothersome): Diarrhea Hair loss Loss of appetite with weight loss Nausea Pain, redness, or swelling with sores inside the mouth or throat This list may not describe all possible side effects. Call your doctor for medical advice about side  effects. You may report side effects to FDA at 1-800-FDA-1088. Where should I keep my medication? This medication is given in a hospital or clinic. It will not be stored at home. NOTE: This sheet is a summary. It may not cover all possible information. If you have questions about this medicine, talk to your doctor, pharmacist, or health care provider.  2023 Elsevier/Gold Standard (2021-07-08 00:00:00)  Fluorouracil Injection What is this  medication? FLUOROURACIL (flure oh YOOR a sil) treats some types of cancer. It works by slowing down the growth of cancer cells. This medicine may be used for other purposes; ask your health care provider or pharmacist if you have questions. COMMON BRAND NAME(S): Adrucil What should I tell my care team before I take this medication? They need to know if you have any of these conditions: Blood disorders Dihydropyrimidine dehydrogenase (DPD) deficiency Infection, such as chickenpox, cold sores, herpes Kidney disease Liver disease Poor nutrition Recent or ongoing radiation therapy An unusual or allergic reaction to fluorouracil, other medications, foods, dyes, or preservatives If you or your partner are pregnant or trying to get pregnant Breast-feeding How should I use this medication? This medication is injected into a vein. It is administered by your care team in a hospital or clinic setting. Talk to your care team about the use of this medication in children. Special care may be needed. Overdosage: If you think you have taken too much of this medicine contact a poison control center or emergency room at once. NOTE: This medicine is only for you. Do not share this medicine with others. What if I miss a dose? Keep appointments for follow-up doses. It is important not to miss your dose. Call your care team if you are unable to keep an appointment. What may interact with this medication? Do not take this medication with any of the following: Live virus vaccines This medication may also interact with the following: Medications that treat or prevent blood clots, such as warfarin, enoxaparin, dalteparin This list may not describe all possible interactions. Give your health care provider a list of all the medicines, herbs, non-prescription drugs, or dietary supplements you use. Also tell them if you smoke, drink alcohol, or use illegal drugs. Some items may interact with your medicine. What  should I watch for while using this medication? Your condition will be monitored carefully while you are receiving this medication. This medication may make you feel generally unwell. This is not uncommon as chemotherapy can affect healthy cells as well as cancer cells. Report any side effects. Continue your course of treatment even though you feel ill unless your care team tells you to stop. In some cases, you may be given additional medications to help with side effects. Follow all directions for their use. This medication may increase your risk of getting an infection. Call your care team for advice if you get a fever, chills, sore throat, or other symptoms of a cold or flu. Do not treat yourself. Try to avoid being around people who are sick. This medication may increase your risk to bruise or bleed. Call your care team if you notice any unusual bleeding. Be careful brushing or flossing your teeth or using a toothpick because you may get an infection or bleed more easily. If you have any dental work done, tell your dentist you are receiving this medication. Avoid taking medications that contain aspirin, acetaminophen, ibuprofen, naproxen, or ketoprofen unless instructed by your care team. These medications may hide a fever. Do  not treat diarrhea with over the counter products. Contact your care team if you have diarrhea that lasts more than 2 days or if it is severe and watery. This medication can make you more sensitive to the sun. Keep out of the sun. If you cannot avoid being in the sun, wear protective clothing and sunscreen. Do not use sun lamps, tanning beds, or tanning booths. Talk to your care team if you or your partner wish to become pregnant or think you might be pregnant. This medication can cause serious birth defects if taken during pregnancy and for 3 months after the last dose. A reliable form of contraception is recommended while taking this medication and for 3 months after the last  dose. Talk to your care team about effective forms of contraception. Do not father a child while taking this medication and for 3 months after the last dose. Use a condom while having sex during this time period. Do not breastfeed while taking this medication. This medication may cause infertility. Talk to your care team if you are concerned about your fertility. What side effects may I notice from receiving this medication? Side effects that you should report to your care team as soon as possible: Allergic reactions--skin rash, itching, hives, swelling of the face, lips, tongue, or throat Heart attack--pain or tightness in the chest, shoulders, arms, or jaw, nausea, shortness of breath, cold or clammy skin, feeling faint or lightheaded Heart failure--shortness of breath, swelling of the ankles, feet, or hands, sudden weight gain, unusual weakness or fatigue Heart rhythm changes--fast or irregular heartbeat, dizziness, feeling faint or lightheaded, chest pain, trouble breathing High ammonia level--unusual weakness or fatigue, confusion, loss of appetite, nausea, vomiting, seizures Infection--fever, chills, cough, sore throat, wounds that don't heal, pain or trouble when passing urine, general feeling of discomfort or being unwell Low red blood cell level--unusual weakness or fatigue, dizziness, headache, trouble breathing Pain, tingling, or numbness in the hands or feet, muscle weakness, change in vision, confusion or trouble speaking, loss of balance or coordination, trouble walking, seizures Redness, swelling, and blistering of the skin over hands and feet Severe or prolonged diarrhea Unusual bruising or bleeding Side effects that usually do not require medical attention (report to your care team if they continue or are bothersome): Dry skin Headache Increased tears Nausea Pain, redness, or swelling with sores inside the mouth or throat Sensitivity to light Vomiting This list may not  describe all possible side effects. Call your doctor for medical advice about side effects. You may report side effects to FDA at 1-800-FDA-1088. Where should I keep my medication? This medication is given in a hospital or clinic. It will not be stored at home. NOTE: This sheet is a summary. It may not cover all possible information. If you have questions about this medicine, talk to your doctor, pharmacist, or health care provider.  2023 Elsevier/Gold Standard (2021-06-16 00:00:00)

## 2022-03-08 NOTE — Assessment & Plan Note (Signed)
Non specific. Attention on follow up  CT also showed patchy consolidation of right lung, clinically patient denies any sob, chest pain or cough.

## 2022-03-08 NOTE — Assessment & Plan Note (Addendum)
Check iron panel- consistent with iron deficiency anemia.  I discussed option of IV Venofer treatments. Oral iron supplementation is not preferred due to possible GI side effect to complicate her chemoradiation course.  I discussed about the potential risks including but not limited to allergic reactions/infusion reactions including anaphylactic reactions, phlebitis, high blood pressure, wheezing, SOB, skin rash, weight gain,dark urine, leg swelling, back pain, headache, nausea and fatigue, etc. Patient agrees with the plan Plan IV venofer weekly x 4

## 2022-03-08 NOTE — Assessment & Plan Note (Signed)
Pathology and images were reviewed and discussed with patient. 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  Discussed with surgery Dr.White.  Labs are reviewed and discussed with patient. Proceed with D1 5-FU/mitomycin C  Follow up with radiation oncology for concurrent radiation.

## 2022-03-09 ENCOUNTER — Ambulatory Visit
Admission: RE | Admit: 2022-03-09 | Discharge: 2022-03-09 | Disposition: A | Discharge status: Home / Self Care | Payer: Medicaid Other | Source: Ambulatory Visit | Attending: Radiation Oncology | Admitting: Radiation Oncology

## 2022-03-09 ENCOUNTER — Telehealth: Payer: Self-pay

## 2022-03-09 ENCOUNTER — Other Ambulatory Visit: Payer: Self-pay

## 2022-03-09 DIAGNOSIS — Z510 Encounter for antineoplastic radiation therapy: Secondary | ICD-10-CM | POA: Diagnosis not present

## 2022-03-09 LAB — RAD ONC ARIA SESSION SUMMARY
Course Elapsed Days: 0
Plan Fractions Treated to Date: 1
Plan Prescribed Dose Per Fraction: 1.8 Gy
Plan Total Fractions Prescribed: 30
Plan Total Prescribed Dose: 54 Gy
Reference Point Dosage Given to Date: 1.8 Gy
Reference Point Session Dosage Given: 1.8 Gy
Session Number: 1

## 2022-03-09 NOTE — Telephone Encounter (Signed)
Telephone call to patient for follow up after receiving first infusion.   Patient states infusion went great.  States eating good and drinking plenty of fluids.   Denies any nausea or vomiting.  Encouraged patient to call for any concerns or questions. 

## 2022-03-09 NOTE — Telephone Encounter (Signed)
Cheryl Goodwin will you please schedule these and notify patient of appointment dates and times.

## 2022-03-09 NOTE — Telephone Encounter (Signed)
-----   Message from Earlie Server, MD sent at 03/08/2022  8:17 PM EST ----- Iron level is low. Please arrange her to get IV venofer weekly x 4. She is aware about IV iron.

## 2022-03-10 ENCOUNTER — Ambulatory Visit
Admission: RE | Admit: 2022-03-10 | Discharge: 2022-03-10 | Disposition: A | Discharge status: Home / Self Care | Payer: Medicaid Other | Source: Ambulatory Visit | Attending: Radiation Oncology | Admitting: Radiation Oncology

## 2022-03-10 ENCOUNTER — Other Ambulatory Visit: Payer: Self-pay

## 2022-03-10 DIAGNOSIS — Z510 Encounter for antineoplastic radiation therapy: Secondary | ICD-10-CM | POA: Diagnosis not present

## 2022-03-10 LAB — RAD ONC ARIA SESSION SUMMARY
Course Elapsed Days: 1
Plan Fractions Treated to Date: 2
Plan Prescribed Dose Per Fraction: 1.8 Gy
Plan Total Fractions Prescribed: 30
Plan Total Prescribed Dose: 54 Gy
Reference Point Dosage Given to Date: 3.6 Gy
Reference Point Session Dosage Given: 1.8 Gy
Session Number: 2

## 2022-03-11 ENCOUNTER — Other Ambulatory Visit: Payer: Self-pay | Admitting: Oncology

## 2022-03-11 ENCOUNTER — Other Ambulatory Visit: Payer: Self-pay

## 2022-03-11 ENCOUNTER — Ambulatory Visit
Admission: RE | Admit: 2022-03-11 | Discharge: 2022-03-11 | Disposition: A | Discharge status: Home / Self Care | Payer: Medicaid Other | Source: Ambulatory Visit | Attending: Radiation Oncology | Admitting: Radiation Oncology

## 2022-03-11 DIAGNOSIS — Z510 Encounter for antineoplastic radiation therapy: Secondary | ICD-10-CM | POA: Diagnosis not present

## 2022-03-11 LAB — RAD ONC ARIA SESSION SUMMARY
Course Elapsed Days: 2
Plan Fractions Treated to Date: 3
Plan Prescribed Dose Per Fraction: 1.8 Gy
Plan Total Fractions Prescribed: 30
Plan Total Prescribed Dose: 54 Gy
Reference Point Dosage Given to Date: 5.4 Gy
Reference Point Session Dosage Given: 1.8 Gy
Session Number: 3

## 2022-03-11 MED ORDER — TRAMADOL HCL 50 MG PO TABS: 50.0000 mg | ORAL_TABLET | Freq: Four times a day (QID) | ORAL | 0 refills | Status: DC | PRN

## 2022-03-11 MED FILL — Iron Sucrose Inj 20 MG/ML (Fe Equiv): INTRAVENOUS | Qty: 10 | Status: AC

## 2022-03-12 ENCOUNTER — Inpatient Hospital Stay: Payer: Medicaid Other

## 2022-03-12 ENCOUNTER — Ambulatory Visit
Admission: RE | Admit: 2022-03-12 | Discharge: 2022-03-12 | Disposition: A | Discharge status: Home / Self Care | Payer: Medicaid Other | Source: Ambulatory Visit | Attending: Radiation Oncology | Admitting: Radiation Oncology

## 2022-03-12 ENCOUNTER — Other Ambulatory Visit: Payer: Self-pay

## 2022-03-12 DIAGNOSIS — C210 Malignant neoplasm of anus, unspecified: Secondary | ICD-10-CM

## 2022-03-12 DIAGNOSIS — Z510 Encounter for antineoplastic radiation therapy: Secondary | ICD-10-CM | POA: Diagnosis not present

## 2022-03-12 LAB — RAD ONC ARIA SESSION SUMMARY
Course Elapsed Days: 3
Plan Fractions Treated to Date: 4
Plan Prescribed Dose Per Fraction: 1.8 Gy
Plan Total Fractions Prescribed: 30
Plan Total Prescribed Dose: 54 Gy
Reference Point Dosage Given to Date: 7.2 Gy
Reference Point Session Dosage Given: 1.8 Gy
Session Number: 4

## 2022-03-12 MED ORDER — HEPARIN SOD (PORK) LOCK FLUSH 100 UNIT/ML IV SOLN
500.0000 [IU] | Freq: Once | INTRAVENOUS | Status: AC | PRN
  Administered 2022-03-12: 500 [IU]
  Filled 2022-03-12: qty 5

## 2022-03-12 MED ORDER — SODIUM CHLORIDE 0.9% FLUSH
10.0000 mL | INTRAVENOUS | Status: DC | PRN
  Administered 2022-03-12: 10 mL
  Filled 2022-03-12: qty 10

## 2022-03-13 ENCOUNTER — Telehealth: Payer: Self-pay | Admitting: Internal Medicine

## 2022-03-13 MED ORDER — NIRMATRELVIR&RITONAVIR 300/100 20 X 150 MG & 10 X 100MG PO TBPK: ORAL_TABLET | ORAL | 0 refills | Status: DC

## 2022-03-13 NOTE — Telephone Encounter (Signed)
On call page received-   Patient tested positive for COVID today morning. She has headaches, chills and nausea. Denies fever.   She meets indication for Paxlovid. She is on active treatment with chemotherapy and radiation.   I reviewed the drug interactions. There is no reported interaction with chemotherapy. Clinically insignificant interaction with omeprazole, zoloft, buproprion.   Clinically significant interaction with lovastatin. Pt advised to hold lovastatin 12 hours prior during the 5 days of treatment and for 5 days after completing paxlovid.   Paxlovid 300 mg/100 mg BID x 5 days sent.   Will cc. Dr. Tasia Catchings if chemo needs to be rescheduled.

## 2022-03-15 ENCOUNTER — Ambulatory Visit: Payer: Medicaid Other

## 2022-03-15 ENCOUNTER — Telehealth: Payer: Self-pay

## 2022-03-15 ENCOUNTER — Other Ambulatory Visit: Payer: Self-pay

## 2022-03-15 NOTE — Telephone Encounter (Signed)
Per Mirian Mo, RN: patient call to report that she has tested positive for covid on 1/12 and they have cancelled radiation treatments this week.   Due to covid, appt will be adjusted, Please update pt on appts:   -cancel venofer this week  -reschedule lab/ MD/ IVF on 1/18 to next week (ok to DB)

## 2022-03-15 NOTE — Telephone Encounter (Signed)
This message has been handled by Dr. Loni Muse- see provider's documentation

## 2022-03-16 ENCOUNTER — Ambulatory Visit: Payer: Medicaid Other

## 2022-03-16 ENCOUNTER — Inpatient Hospital Stay: Payer: Medicaid Other

## 2022-03-16 ENCOUNTER — Other Ambulatory Visit: Payer: Self-pay

## 2022-03-17 ENCOUNTER — Ambulatory Visit: Payer: Medicaid Other

## 2022-03-18 ENCOUNTER — Ambulatory Visit: Payer: Medicaid Other

## 2022-03-18 ENCOUNTER — Inpatient Hospital Stay: Payer: Medicaid Other | Admitting: Oncology

## 2022-03-18 ENCOUNTER — Inpatient Hospital Stay: Payer: Medicaid Other

## 2022-03-19 ENCOUNTER — Ambulatory Visit: Payer: Medicaid Other

## 2022-03-22 ENCOUNTER — Inpatient Hospital Stay: Payer: Medicaid Other

## 2022-03-22 ENCOUNTER — Inpatient Hospital Stay (HOSPITAL_BASED_OUTPATIENT_CLINIC_OR_DEPARTMENT_OTHER): Payer: Medicaid Other | Admitting: Oncology

## 2022-03-22 ENCOUNTER — Encounter: Payer: Self-pay | Admitting: Oncology

## 2022-03-22 ENCOUNTER — Ambulatory Visit
Admission: RE | Admit: 2022-03-22 | Discharge: 2022-03-22 | Disposition: A | Discharge status: Home / Self Care | Payer: Medicaid Other | Source: Ambulatory Visit | Attending: Radiation Oncology | Admitting: Radiation Oncology

## 2022-03-22 ENCOUNTER — Other Ambulatory Visit: Payer: Self-pay

## 2022-03-22 VITALS — BP 128/79 | HR 82 | Temp 98.2°F | Resp 18 | Wt 152.4 lb

## 2022-03-22 DIAGNOSIS — D500 Iron deficiency anemia secondary to blood loss (chronic): Secondary | ICD-10-CM

## 2022-03-22 DIAGNOSIS — Z510 Encounter for antineoplastic radiation therapy: Secondary | ICD-10-CM | POA: Diagnosis not present

## 2022-03-22 DIAGNOSIS — C210 Malignant neoplasm of anus, unspecified: Secondary | ICD-10-CM | POA: Diagnosis not present

## 2022-03-22 DIAGNOSIS — R918 Other nonspecific abnormal finding of lung field: Secondary | ICD-10-CM | POA: Diagnosis not present

## 2022-03-22 DIAGNOSIS — D709 Neutropenia, unspecified: Secondary | ICD-10-CM | POA: Insufficient documentation

## 2022-03-22 DIAGNOSIS — D708 Other neutropenia: Secondary | ICD-10-CM | POA: Diagnosis not present

## 2022-03-22 LAB — COMPREHENSIVE METABOLIC PANEL
ALT: 16 U/L (ref 0–44)
AST: 21 U/L (ref 15–41)
Albumin: 3.8 g/dL (ref 3.5–5.0)
Alkaline Phosphatase: 40 U/L (ref 38–126)
Anion gap: 9 (ref 5–15)
BUN: 7 mg/dL (ref 6–20)
CO2: 26 mmol/L (ref 22–32)
Calcium: 8.6 mg/dL — ABNORMAL LOW (ref 8.9–10.3)
Chloride: 100 mmol/L (ref 98–111)
Creatinine, Ser: 0.81 mg/dL (ref 0.44–1.00)
GFR, Estimated: >60 mL/min (ref >60)
Glucose, Bld: 101 mg/dL — ABNORMAL HIGH (ref 70–99)
Potassium: 4.1 mmol/L (ref 3.5–5.1)
Sodium: 135 mmol/L (ref 135–145)
Total Bilirubin: 0.2 mg/dL — ABNORMAL LOW (ref 0.3–1.2)
Total Protein: 7.3 g/dL (ref 6.5–8.1)

## 2022-03-22 LAB — RAD ONC ARIA SESSION SUMMARY
Course Elapsed Days: 13
Plan Fractions Treated to Date: 5
Plan Prescribed Dose Per Fraction: 1.8 Gy
Plan Total Fractions Prescribed: 30
Plan Total Prescribed Dose: 54 Gy
Reference Point Dosage Given to Date: 9 Gy
Reference Point Session Dosage Given: 1.8 Gy
Session Number: 5

## 2022-03-22 LAB — CBC WITH DIFFERENTIAL/PLATELET
Abs Immature Granulocytes: 0.01 10*3/uL (ref 0.00–0.07)
Basophils Absolute: 0 10*3/uL (ref 0.0–0.1)
Basophils Relative: 0 %
Eosinophils Absolute: 0.5 10*3/uL (ref 0.0–0.5)
Eosinophils Relative: 19 %
HCT: 26.6 % — ABNORMAL LOW (ref 36.0–46.0)
Hemoglobin: 9.1 g/dL — ABNORMAL LOW (ref 12.0–15.0)
Immature Granulocytes: 0 %
Lymphocytes Relative: 31 %
Lymphs Abs: 0.9 10*3/uL (ref 0.7–4.0)
MCH: 25.5 pg — ABNORMAL LOW (ref 26.0–34.0)
MCHC: 34.2 g/dL (ref 30.0–36.0)
MCV: 74.5 fL — ABNORMAL LOW (ref 80.0–100.0)
Monocytes Absolute: 0.3 10*3/uL (ref 0.1–1.0)
Monocytes Relative: 9 %
Neutro Abs: 1.1 10*3/uL — ABNORMAL LOW (ref 1.7–7.7)
Neutrophils Relative %: 41 %
Platelets: 119 10*3/uL — ABNORMAL LOW (ref 150–400)
RBC: 3.57 MIL/uL — ABNORMAL LOW (ref 3.87–5.11)
RDW: 14.9 % (ref 11.5–15.5)
WBC: 2.8 10*3/uL — ABNORMAL LOW (ref 4.0–10.5)
nRBC: 0 % (ref 0.0–0.2)

## 2022-03-22 MED ORDER — MAGIC MOUTHWASH W/LIDOCAINE: 5.0000 mL | Freq: Four times a day (QID) | ORAL | 1 refills | Status: DC | PRN

## 2022-03-22 MED ORDER — HEPARIN SOD (PORK) LOCK FLUSH 100 UNIT/ML IV SOLN
500.0000 [IU] | Freq: Once | INTRAVENOUS | Status: AC
  Administered 2022-03-22: 500 [IU] via INTRAVENOUS
  Filled 2022-03-22: qty 5

## 2022-03-22 MED ORDER — SODIUM CHLORIDE 0.9% FLUSH
10.0000 mL | Freq: Once | INTRAVENOUS | Status: AC
  Administered 2022-03-22: 10 mL via INTRAVENOUS
  Filled 2022-03-22: qty 10

## 2022-03-22 NOTE — Assessment & Plan Note (Signed)
Likely due to chemotherapy/recent COVID-19 infection and radiation.  ANC is above 1.  Continue monitor.

## 2022-03-22 NOTE — Assessment & Plan Note (Signed)
Pathology and images were reviewed and discussed with patient. 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  Discussed with surgery Dr.White.  Labs are reviewed and discussed with patient. S/p D1 5-FU/mitomycin C  Follow up with radiation oncology for concurrent radiation.  She will return on 2/5 for next chemo

## 2022-03-22 NOTE — Assessment & Plan Note (Signed)
iron deficiency anemia.  Plan IV venofer weekly x 4

## 2022-03-22 NOTE — Assessment & Plan Note (Signed)
Non specific.  CT also showed patchy consolidation of right lung, clinically patient denies any sob, chest pain or cough.  Will repeat CT short term for follow up

## 2022-03-22 NOTE — Progress Notes (Signed)
Pt here for follow up. Pt reports that she has been having back pain problems and would like a prescription for a TENS unit.

## 2022-03-22 NOTE — Progress Notes (Signed)
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  Discussed with surgery Dr.White.  Labs are reviewed and discussed with patient. S/p D1 5-FU/mitomycin C  Follow up with radiation oncology for concurrent radiation.  She will return on 2/5 for next chemo  IDA (iron deficiency anemia)  iron deficiency anemia.  Plan IV venofer weekly x 4   Lung nodules Non specific.  CT also showed patchy consolidation of right lung, clinically patient denies any sob, chest pain or cough.  Will repeat CT short term for follow up   Neutropenia ([place]) Likely due to chemotherapy/recent COVID-19 infection and radiation.  ANC is above 1.  Continue monitor.   Follow up per LOS  All questions were answered. The patient knows to call the clinic with any problems, questions or concerns.  Earlie Server, MD, PhD [facility] Hematology Oncology 03/22/2022     CHIEF COMPLAINTS/PURPOSE OF CONSULTATION:  Anal squamous carcinoma  HISTORY OF PRESENTING ILLNESS:  Cheryl Goodwin 62 y.o. female presents to establish care for Anal squamous carcinoma I have reviewed her chart and materials related to her cancer extensively and collaborated history with the patient. 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  Denies any difficulty passing bowel movements, blood in the stool.  01/18/2022, colonoscopy showed anal mass 0 to  1 cm from the anal verge.  Likely malignant tumor in the distal rectum, biopsied.  Nonbleeding internal hemorrhoids.   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  Superficial biopsies, invasion cannot be accessed.   01/26/2022 Cancer Staging   Staging form: Anus, AJCC V9 - Clinical stage from 01/26/2022: Stage IIIC (cT4, cN1, cM0) - Signed by Earlie Server, MD on 02/06/2022 Stage prefix: Initial diagnosis   01/29/2022 Imaging   CT chest abdomen w contrast  1. No evidence of metastatic disease in the abdomen. 2. No definite findings of metastatic disease in the chest. Two solid pulmonary nodules, largest 0.5 cm in the right lower lobe.Recommend attention on follow-up chest CT in 3 months. 3. Mild patchy consolidation in the posterior right upper lobe with mild to moderate patchy ground-glass opacities in the right upper,right middle and right lower lobes, most suggestive of a mild multilobar pneumonia. 4. Moderate hiatal hernia. 5. Moderate to severe L1 vertebral compression fracture, chronic appearing.   02/03/2022 Imaging   MR pelvis wo contrast 1. Anorectal mass extending from the anterior and lateral LEFT low rectum through the anal canal and involving the LEFT sphincter complex to include the intersphincteric plane with abutment and potential early involvement of external sphincter. Also with suspicion for early encroachment upon the lower rectovaginal plane with extension towards the lower vagina. 2. Signs of LEFT inguinal nodal disease. Other small irregular lymph nodes particularly on the LEFT suspicious with a single necrotic appearing node that is highly  suspicious for if not diagnostic of inguinal nodal involvement. Note that the full pelvis is not assessed on this anorectal focused evaluation. No adenopathy seen along the pelvic  sidewalls or about the mesorectum.      02/11/2022 Procedure   Medi port placed by IR   03/08/2022 -  Chemotherapy   Patient is on Treatment Plan : ANUS Mitomycin D1,28 + 5FU D1-4, 28-31 q32d      Patient is widowed and current not sexually active. Denies smoking or alcohol use. She has an upcoming gynecology appointment.  Denies any abdominal pain, cough, shortness of breath nausea vomiting.  Denies any blood in the stool.  She has established care with Dr.White and was recommended to proceed with concurrent chemotherapy and Radiation.    INTERVAL HISTORY Cheryl Goodwin is a 62 y.o. female who has above history reviewed by me today presents for follow up visit for anal cancer and iron deficiency anemia.  During interval, patient has had COVID 90 infection.  Treated with Paxlovid.  Patient reports that her symptoms was moderate and has already improved.  Today she has no new complaints.    MEDICAL HISTORY:  Past Medical History:  Diagnosis Date   Depression    GERD (gastroesophageal reflux disease)    Hyperlipidemia     SURGICAL HISTORY: Past Surgical History:  Procedure Laterality Date   AUGMENTATION MAMMAPLASTY Bilateral 2005   with lift   BACK SURGERY  09/24/2014   L1 fracture, [facility]   BREAST ENHANCEMENT SURGERY  2007   COLONOSCOPY  2013   ENDOMETRIAL ABLATION  2013   ESOPHAGOGASTRODUODENOSCOPY (EGD) WITH PROPOFOL N/A 07/08/2016   Procedure: ESOPHAGOGASTRODUODENOSCOPY (EGD) WITH PROPOFOL;  Surgeon: Lucilla Lame, MD;  Location: [place];  Service: Endoscopy;  Laterality: N/A;   IR IMAGING GUIDED PORT INSERTION  02/11/2022   STOMACH SURGERY  2006   "Tummy Tuck"   TUBAL LIGATION      SOCIAL HISTORY: Social History   Socioeconomic History   Marital status: Widowed    Spouse name: Not on file   Number of children: 2   Years of education: Not on file   Highest education level: Not on file  Occupational History   Occupation: Unemployed  Tobacco Use    Smoking status: Never   Smokeless tobacco: Never  Substance and Sexual Activity   Alcohol use: Yes    Comment: 10 beers/month   Drug use: No   Sexual activity: Not on file  Other Topics Concern   Not on file  Social History Narrative   Not on file   Social Determinants of Health   Financial Resource Strain: Not on file  Food Insecurity: Not on file  Transportation Needs: Not on file  Physical Activity: Not on file  Stress: Not on file  Social Connections: Not on file  Intimate Partner Violence: Not on file    FAMILY HISTORY: Family History  Problem Relation Age of Onset   Stroke Mother    Breast cancer Mother    Emphysema Father        was a smoker   COPD Sister        was a smoker   Cancer Brother     ALLERGIES:  has No Known Allergies.  MEDICATIONS:  Current Outpatient Medications  Medication Sig Dispense Refill   buPROPion (WELLBUTRIN XL) 300 MG 24 hr tablet Take 300 mg by mouth daily.     esomeprazole (NEXIUM) 40 MG capsule Take by mouth.     lidocaine-prilocaine (  EMLA) cream Apply to affected area once 30 g 3   lovastatin (MEVACOR) 20 MG tablet SMARTSIG:1 Tablet(s) By Mouth Every Evening     magic mouthwash w/lidocaine SOLN Take 5 mLs by mouth 4 (four) times daily as needed for mouth pain. Sig: Swish/Swallow 5-10 ml four times a day as needed. Dispense 480 ml. 1RF 480 mL 1   omeprazole (PRILOSEC) 40 MG capsule Take 1 capsule (40 mg total) by mouth daily. 30 capsule 3   ondansetron (ZOFRAN) 8 MG tablet Take 1 tablet (8 mg total) by mouth every 8 (eight) hours as needed for nausea or vomiting. 30 tablet 1   prochlorperazine (COMPAZINE) 10 MG tablet Take 1 tablet (10 mg total) by mouth every 6 (six) hours as needed for nausea or vomiting. 30 tablet 1   sertraline (ZOLOFT) 100 MG tablet Take 150 mg by mouth daily.     simvastatin (ZOCOR) 20 MG tablet Take 20 mg by mouth every evening.     traMADol (ULTRAM) 50 MG tablet Take 1 tablet (50 mg total) by mouth every 6  (six) hours as needed. 30 tablet 0   No current facility-administered medications for this visit.    Review of Systems  Constitutional:  Negative for appetite change, chills, fatigue and fever.  HENT:   Negative for hearing loss and voice change.   Eyes:  Negative for eye problems.  Respiratory:  Negative for chest tightness and cough.   Cardiovascular:  Negative for chest pain.  Gastrointestinal:  Negative for abdominal distention, abdominal pain and blood in stool.  Endocrine: Negative for hot flashes.  Genitourinary:  Negative for difficulty urinating and frequency.   Musculoskeletal:  Negative for arthralgias.  Skin:  Negative for itching and rash.  Neurological:  Negative for extremity weakness.  Hematological:  Negative for adenopathy.  Psychiatric/Behavioral:  Negative for confusion.      PHYSICAL EXAMINATION: ECOG PERFORMANCE STATUS: 0 - Asymptomatic  Vitals:   03/22/22 1032  BP: 128/79  Pulse: 82  Resp: 18  Temp: 98.2 F (36.8 C)   Filed Weights   03/22/22 1032  Weight: 152 lb 6.4 oz (69.1 kg)    Physical Exam Constitutional:      General: She is not in acute distress.    Appearance: She is not diaphoretic.  HENT:     Head: Normocephalic.     Nose: Nose normal.     Mouth/Throat:     Pharynx: No oropharyngeal exudate.  Eyes:     General: No scleral icterus.    Pupils: Pupils are equal, round, and reactive to light.  Cardiovascular:     Rate and Rhythm: Normal rate.     Heart sounds: No murmur heard. Pulmonary:     Effort: Pulmonary effort is normal. No respiratory distress.     Breath sounds: No wheezing.  Abdominal:     General: There is no distension.     Palpations: Abdomen is soft.     Tenderness: There is no abdominal tenderness.  Musculoskeletal:        General: Normal range of motion.     Cervical back: Normal range of motion and neck supple.  Skin:    General: Skin is warm and dry.     Findings: No erythema.  Neurological:     Mental  Status: She is alert and oriented to person, place, and time.     Cranial Nerves: No cranial nerve deficit.     Motor: No abnormal muscle tone.  Coordination: Coordination normal.  Psychiatric:        Mood and Affect: Affect normal.      LABORATORY DATA:  I have reviewed the data as listed    Latest Ref Rng & Units 03/22/2022   10:25 AM 03/08/2022    2:35 PM 01/26/2022    4:14 PM  CBC  WBC 4.0 - 10.5 K/uL 2.8  6.4  7.2   Hemoglobin 12.0 - 15.0 g/dL 9.1  10.8  11.3   Hematocrit 36.0 - 46.0 % 26.6  32.9  34.1   Platelets 150 - 400 K/uL 119  376  399       Latest Ref Rng & Units 03/22/2022   10:25 AM 03/08/2022    2:35 PM 01/26/2022    4:14 PM  CMP  Glucose 70 - 99 mg/dL 101  101  88   BUN 6 - 20 mg/dL '7  10  10   '$ Creatinine 0.44 - 1.00 mg/dL 0.81  0.80  0.80   Sodium 135 - 145 mmol/L 135  132  131   Potassium 3.5 - 5.1 mmol/L 4.1  3.9  4.0   Chloride 98 - 111 mmol/L 100  100  99   CO2 22 - 32 mmol/L '26  23  22   '$ Calcium 8.9 - 10.3 mg/dL 8.6  8.9  9.3   Total Protein 6.5 - 8.1 g/dL 7.3  7.8  8.6   Total Bilirubin 0.3 - 1.2 mg/dL 0.2  0.4  0.5   Alkaline Phos 38 - 126 U/L 40  46  46   AST 15 - 41 U/L '21  23  24   '$ ALT 0 - 44 U/L '16  15  17      '$ RADIOGRAPHIC STUDIES: I have personally reviewed the radiological images as listed and agreed with the findings in the report. No results found.

## 2022-03-23 ENCOUNTER — Ambulatory Visit
Admission: RE | Admit: 2022-03-23 | Discharge: 2022-03-23 | Disposition: A | Discharge status: Home / Self Care | Payer: Medicaid Other | Source: Ambulatory Visit | Attending: Radiation Oncology | Admitting: Radiation Oncology

## 2022-03-23 ENCOUNTER — Other Ambulatory Visit: Payer: Self-pay

## 2022-03-23 ENCOUNTER — Inpatient Hospital Stay: Payer: Medicaid Other

## 2022-03-23 ENCOUNTER — Encounter: Payer: Self-pay | Admitting: Oncology

## 2022-03-23 VITALS — BP 113/76 | HR 76 | Temp 99.2°F | Resp 18

## 2022-03-23 DIAGNOSIS — Z510 Encounter for antineoplastic radiation therapy: Secondary | ICD-10-CM | POA: Diagnosis not present

## 2022-03-23 DIAGNOSIS — D500 Iron deficiency anemia secondary to blood loss (chronic): Secondary | ICD-10-CM

## 2022-03-23 LAB — RAD ONC ARIA SESSION SUMMARY
Course Elapsed Days: 14
Plan Fractions Treated to Date: 6
Plan Prescribed Dose Per Fraction: 1.8 Gy
Plan Total Fractions Prescribed: 30
Plan Total Prescribed Dose: 54 Gy
Reference Point Dosage Given to Date: 10.8 Gy
Reference Point Session Dosage Given: 1.8 Gy
Session Number: 6

## 2022-03-23 MED ORDER — NYSTATIN 100000 UNIT/ML MT SUSP
5.0000 mL | Freq: Four times a day (QID) | OROMUCOSAL | 1 refills | Status: DC
  Filled 2022-03-23: qty 280, 14d supply, fill #0

## 2022-03-23 MED ORDER — SODIUM CHLORIDE 0.9 % IV SOLN
Freq: Once | INTRAVENOUS | Status: AC
  Filled 2022-03-23: qty 250

## 2022-03-23 MED ORDER — SODIUM CHLORIDE 0.9 % IV SOLN
200.0000 mg | Freq: Once | INTRAVENOUS | Status: AC
  Administered 2022-03-23: 200 mg via INTRAVENOUS
  Filled 2022-03-23: qty 200

## 2022-03-23 MED ORDER — HEPARIN SOD (PORK) LOCK FLUSH 100 UNIT/ML IV SOLN
500.0000 [IU] | Freq: Once | INTRAVENOUS | Status: AC | PRN
  Administered 2022-03-23: 500 [IU]
  Filled 2022-03-23: qty 5

## 2022-03-23 MED ORDER — STERILE WATER FOR INJECTION IJ SOLN
5.0000 mL | Freq: Four times a day (QID) | OROMUCOSAL | 1 refills | Status: DC | PRN
  Filled 2022-03-23 (×2): qty 480, 12d supply, fill #0

## 2022-03-23 NOTE — Progress Notes (Signed)
Patient declined to wait the full 30 minutes post infusion today because of the extreme delay in getting her iron from pharmacy. She tolerated iron well with no incident. Patient educated on the s/s of allergic reaction. Patient verbalized understanding.

## 2022-03-24 ENCOUNTER — Other Ambulatory Visit: Payer: Self-pay

## 2022-03-24 ENCOUNTER — Ambulatory Visit
Admission: RE | Admit: 2022-03-24 | Discharge: 2022-03-24 | Disposition: A | Discharge status: Home / Self Care | Payer: Medicaid Other | Source: Ambulatory Visit | Attending: Radiation Oncology | Admitting: Radiation Oncology

## 2022-03-24 ENCOUNTER — Other Ambulatory Visit: Payer: Self-pay | Admitting: Oncology

## 2022-03-24 DIAGNOSIS — Z510 Encounter for antineoplastic radiation therapy: Secondary | ICD-10-CM | POA: Diagnosis not present

## 2022-03-24 LAB — RAD ONC ARIA SESSION SUMMARY
Course Elapsed Days: 15
Plan Fractions Treated to Date: 7
Plan Prescribed Dose Per Fraction: 1.8 Gy
Plan Total Fractions Prescribed: 30
Plan Total Prescribed Dose: 54 Gy
Reference Point Dosage Given to Date: 12.6 Gy
Reference Point Session Dosage Given: 1.8 Gy
Session Number: 7

## 2022-03-24 MED ORDER — HYDROCORTISONE 0.5 % EX CREA: 1.0000 | TOPICAL_CREAM | Freq: Three times a day (TID) | CUTANEOUS | 3 refills | Status: DC

## 2022-03-25 ENCOUNTER — Ambulatory Visit
Admission: RE | Admit: 2022-03-25 | Discharge: 2022-03-25 | Disposition: A | Discharge status: Home / Self Care | Payer: Medicaid Other | Source: Ambulatory Visit | Attending: Radiation Oncology | Admitting: Radiation Oncology

## 2022-03-25 ENCOUNTER — Other Ambulatory Visit: Payer: Self-pay

## 2022-03-25 DIAGNOSIS — Z510 Encounter for antineoplastic radiation therapy: Secondary | ICD-10-CM | POA: Diagnosis not present

## 2022-03-25 LAB — RAD ONC ARIA SESSION SUMMARY
Course Elapsed Days: 16
Plan Fractions Treated to Date: 8
Plan Prescribed Dose Per Fraction: 1.8 Gy
Plan Total Fractions Prescribed: 30
Plan Total Prescribed Dose: 54 Gy
Reference Point Dosage Given to Date: 14.4 Gy
Reference Point Session Dosage Given: 1.8 Gy
Session Number: 8

## 2022-03-26 ENCOUNTER — Other Ambulatory Visit: Payer: Self-pay

## 2022-03-26 ENCOUNTER — Ambulatory Visit
Admission: RE | Admit: 2022-03-26 | Discharge: 2022-03-26 | Disposition: A | Discharge status: Home / Self Care | Payer: Medicaid Other | Source: Ambulatory Visit | Attending: Radiation Oncology | Admitting: Radiation Oncology

## 2022-03-26 DIAGNOSIS — Z510 Encounter for antineoplastic radiation therapy: Secondary | ICD-10-CM | POA: Diagnosis not present

## 2022-03-26 LAB — RAD ONC ARIA SESSION SUMMARY
Course Elapsed Days: 17
Plan Fractions Treated to Date: 9
Plan Prescribed Dose Per Fraction: 1.8 Gy
Plan Total Fractions Prescribed: 30
Plan Total Prescribed Dose: 54 Gy
Reference Point Dosage Given to Date: 16.2 Gy
Reference Point Session Dosage Given: 1.8 Gy
Session Number: 9

## 2022-03-29 ENCOUNTER — Other Ambulatory Visit: Payer: Self-pay

## 2022-03-29 ENCOUNTER — Ambulatory Visit
Admission: RE | Admit: 2022-03-29 | Discharge: 2022-03-29 | Disposition: A | Discharge status: Home / Self Care | Payer: Medicaid Other | Source: Ambulatory Visit | Attending: Radiation Oncology | Admitting: Radiation Oncology

## 2022-03-29 DIAGNOSIS — Z510 Encounter for antineoplastic radiation therapy: Secondary | ICD-10-CM | POA: Diagnosis not present

## 2022-03-29 LAB — RAD ONC ARIA SESSION SUMMARY
Course Elapsed Days: 20
Plan Fractions Treated to Date: 10
Plan Prescribed Dose Per Fraction: 1.8 Gy
Plan Total Fractions Prescribed: 30
Plan Total Prescribed Dose: 54 Gy
Reference Point Dosage Given to Date: 18 Gy
Reference Point Session Dosage Given: 1.8 Gy
Session Number: 10

## 2022-03-30 ENCOUNTER — Inpatient Hospital Stay: Payer: Medicaid Other

## 2022-03-30 ENCOUNTER — Ambulatory Visit
Admission: RE | Admit: 2022-03-30 | Discharge: 2022-03-30 | Disposition: A | Discharge status: Home / Self Care | Payer: Medicaid Other | Source: Ambulatory Visit | Attending: Radiation Oncology | Admitting: Radiation Oncology

## 2022-03-30 ENCOUNTER — Other Ambulatory Visit: Payer: Self-pay

## 2022-03-30 VITALS — BP 104/81 | HR 82 | Temp 98.5°F | Resp 18

## 2022-03-30 DIAGNOSIS — Z510 Encounter for antineoplastic radiation therapy: Secondary | ICD-10-CM | POA: Diagnosis not present

## 2022-03-30 DIAGNOSIS — D500 Iron deficiency anemia secondary to blood loss (chronic): Secondary | ICD-10-CM

## 2022-03-30 LAB — RAD ONC ARIA SESSION SUMMARY
Course Elapsed Days: 21
Plan Fractions Treated to Date: 11
Plan Prescribed Dose Per Fraction: 1.8 Gy
Plan Total Fractions Prescribed: 30
Plan Total Prescribed Dose: 54 Gy
Reference Point Dosage Given to Date: 19.8 Gy
Reference Point Session Dosage Given: 1.8 Gy
Session Number: 11

## 2022-03-30 MED ORDER — HEPARIN SOD (PORK) LOCK FLUSH 100 UNIT/ML IV SOLN
500.0000 [IU] | Freq: Once | INTRAVENOUS | Status: AC | PRN
  Administered 2022-03-30: 500 [IU]
  Filled 2022-03-30: qty 5

## 2022-03-30 MED ORDER — HEPARIN SOD (PORK) LOCK FLUSH 100 UNIT/ML IV SOLN
INTRAVENOUS | Status: AC
  Filled 2022-03-30: qty 5

## 2022-03-30 MED ORDER — SODIUM CHLORIDE 0.9 % IV SOLN
200.0000 mg | Freq: Once | INTRAVENOUS | Status: AC
  Administered 2022-03-30: 200 mg via INTRAVENOUS
  Filled 2022-03-30: qty 200

## 2022-03-30 MED ORDER — SODIUM CHLORIDE 0.9 % IV SOLN
Freq: Once | INTRAVENOUS | Status: AC
  Filled 2022-03-30: qty 250

## 2022-03-31 ENCOUNTER — Other Ambulatory Visit: Payer: Self-pay

## 2022-03-31 ENCOUNTER — Ambulatory Visit
Admission: RE | Admit: 2022-03-31 | Discharge: 2022-03-31 | Disposition: A | Discharge status: Home / Self Care | Payer: Medicaid Other | Source: Ambulatory Visit | Attending: Radiation Oncology | Admitting: Radiation Oncology

## 2022-03-31 DIAGNOSIS — Z510 Encounter for antineoplastic radiation therapy: Secondary | ICD-10-CM | POA: Diagnosis not present

## 2022-03-31 LAB — RAD ONC ARIA SESSION SUMMARY
Course Elapsed Days: 22
Plan Fractions Treated to Date: 12
Plan Prescribed Dose Per Fraction: 1.8 Gy
Plan Total Fractions Prescribed: 30
Plan Total Prescribed Dose: 54 Gy
Reference Point Dosage Given to Date: 21.6 Gy
Reference Point Session Dosage Given: 1.8 Gy
Session Number: 12

## 2022-04-01 ENCOUNTER — Ambulatory Visit
Admission: RE | Admit: 2022-04-01 | Discharge: 2022-04-01 | Disposition: A | Discharge status: Home / Self Care | Payer: Medicaid Other | Source: Ambulatory Visit | Attending: Radiation Oncology | Admitting: Radiation Oncology

## 2022-04-01 ENCOUNTER — Other Ambulatory Visit: Payer: Self-pay

## 2022-04-01 DIAGNOSIS — E785 Hyperlipidemia, unspecified: Secondary | ICD-10-CM | POA: Insufficient documentation

## 2022-04-01 DIAGNOSIS — F32A Depression, unspecified: Secondary | ICD-10-CM | POA: Diagnosis not present

## 2022-04-01 DIAGNOSIS — C210 Malignant neoplasm of anus, unspecified: Secondary | ICD-10-CM | POA: Diagnosis present

## 2022-04-01 DIAGNOSIS — Z510 Encounter for antineoplastic radiation therapy: Secondary | ICD-10-CM | POA: Diagnosis not present

## 2022-04-01 DIAGNOSIS — Z79899 Other long term (current) drug therapy: Secondary | ICD-10-CM | POA: Insufficient documentation

## 2022-04-01 DIAGNOSIS — K219 Gastro-esophageal reflux disease without esophagitis: Secondary | ICD-10-CM | POA: Diagnosis not present

## 2022-04-01 LAB — RAD ONC ARIA SESSION SUMMARY
Course Elapsed Days: 23
Plan Fractions Treated to Date: 13
Plan Prescribed Dose Per Fraction: 1.8 Gy
Plan Total Fractions Prescribed: 30
Plan Total Prescribed Dose: 54 Gy
Reference Point Dosage Given to Date: 23.4 Gy
Reference Point Session Dosage Given: 1.8 Gy
Session Number: 13

## 2022-04-02 ENCOUNTER — Other Ambulatory Visit: Payer: Self-pay

## 2022-04-02 ENCOUNTER — Ambulatory Visit
Admission: RE | Admit: 2022-04-02 | Discharge: 2022-04-02 | Disposition: A | Discharge status: Home / Self Care | Payer: Medicaid Other | Source: Ambulatory Visit | Attending: Radiation Oncology | Admitting: Radiation Oncology

## 2022-04-02 DIAGNOSIS — Z510 Encounter for antineoplastic radiation therapy: Secondary | ICD-10-CM | POA: Diagnosis not present

## 2022-04-02 LAB — RAD ONC ARIA SESSION SUMMARY
Course Elapsed Days: 24
Plan Fractions Treated to Date: 14
Plan Prescribed Dose Per Fraction: 1.8 Gy
Plan Total Fractions Prescribed: 30
Plan Total Prescribed Dose: 54 Gy
Reference Point Dosage Given to Date: 25.2 Gy
Reference Point Session Dosage Given: 1.8 Gy
Session Number: 14

## 2022-04-04 ENCOUNTER — Encounter: Payer: Self-pay | Admitting: Oncology

## 2022-04-05 ENCOUNTER — Inpatient Hospital Stay (HOSPITAL_BASED_OUTPATIENT_CLINIC_OR_DEPARTMENT_OTHER): Payer: Medicaid Other | Admitting: Oncology

## 2022-04-05 ENCOUNTER — Ambulatory Visit
Admission: RE | Admit: 2022-04-05 | Discharge: 2022-04-05 | Disposition: A | Discharge status: Home / Self Care | Payer: Medicaid Other | Source: Ambulatory Visit | Attending: Radiation Oncology | Admitting: Radiation Oncology

## 2022-04-05 ENCOUNTER — Encounter: Payer: Self-pay | Admitting: Oncology

## 2022-04-05 ENCOUNTER — Inpatient Hospital Stay: Payer: Medicaid Other

## 2022-04-05 ENCOUNTER — Encounter: Payer: Self-pay | Admitting: *Deleted

## 2022-04-05 ENCOUNTER — Other Ambulatory Visit: Payer: Self-pay

## 2022-04-05 VITALS — BP 117/75 | HR 89 | Temp 98.3°F | Resp 18 | Wt 147.3 lb

## 2022-04-05 DIAGNOSIS — R918 Other nonspecific abnormal finding of lung field: Secondary | ICD-10-CM

## 2022-04-05 DIAGNOSIS — Z923 Personal history of irradiation: Secondary | ICD-10-CM | POA: Insufficient documentation

## 2022-04-05 DIAGNOSIS — C210 Malignant neoplasm of anus, unspecified: Secondary | ICD-10-CM

## 2022-04-05 DIAGNOSIS — D509 Iron deficiency anemia, unspecified: Secondary | ICD-10-CM | POA: Insufficient documentation

## 2022-04-05 DIAGNOSIS — D500 Iron deficiency anemia secondary to blood loss (chronic): Secondary | ICD-10-CM | POA: Diagnosis not present

## 2022-04-05 DIAGNOSIS — L290 Pruritus ani: Secondary | ICD-10-CM | POA: Insufficient documentation

## 2022-04-05 DIAGNOSIS — Z79899 Other long term (current) drug therapy: Secondary | ICD-10-CM | POA: Insufficient documentation

## 2022-04-05 DIAGNOSIS — R197 Diarrhea, unspecified: Secondary | ICD-10-CM | POA: Diagnosis not present

## 2022-04-05 DIAGNOSIS — Z510 Encounter for antineoplastic radiation therapy: Secondary | ICD-10-CM | POA: Diagnosis not present

## 2022-04-05 DIAGNOSIS — Z5111 Encounter for antineoplastic chemotherapy: Secondary | ICD-10-CM | POA: Insufficient documentation

## 2022-04-05 LAB — COMPREHENSIVE METABOLIC PANEL
ALT: 17 U/L (ref 0–44)
AST: 26 U/L (ref 15–41)
Albumin: 4.4 g/dL (ref 3.5–5.0)
Alkaline Phosphatase: 48 U/L (ref 38–126)
Anion gap: 11 (ref 5–15)
BUN: 10 mg/dL (ref 6–20)
CO2: 23 mmol/L (ref 22–32)
Calcium: 8.9 mg/dL (ref 8.9–10.3)
Chloride: 98 mmol/L (ref 98–111)
Creatinine, Ser: 0.8 mg/dL (ref 0.44–1.00)
GFR, Estimated: >60 mL/min (ref >60)
Glucose, Bld: 127 mg/dL — ABNORMAL HIGH (ref 70–99)
Potassium: 4 mmol/L (ref 3.5–5.1)
Sodium: 132 mmol/L — ABNORMAL LOW (ref 135–145)
Total Bilirubin: 0.2 mg/dL — ABNORMAL LOW (ref 0.3–1.2)
Total Protein: 7.7 g/dL (ref 6.5–8.1)

## 2022-04-05 LAB — CBC WITH DIFFERENTIAL/PLATELET
Abs Immature Granulocytes: 0.04 10*3/uL (ref 0.00–0.07)
Basophils Absolute: 0 10*3/uL (ref 0.0–0.1)
Basophils Relative: 1 %
Eosinophils Absolute: 0.2 10*3/uL (ref 0.0–0.5)
Eosinophils Relative: 4 %
HCT: 31.8 % — ABNORMAL LOW (ref 36.0–46.0)
Hemoglobin: 10.6 g/dL — ABNORMAL LOW (ref 12.0–15.0)
Immature Granulocytes: 1 %
Lymphocytes Relative: 16 %
Lymphs Abs: 0.9 10*3/uL (ref 0.7–4.0)
MCH: 25.7 pg — ABNORMAL LOW (ref 26.0–34.0)
MCHC: 33.3 g/dL (ref 30.0–36.0)
MCV: 77 fL — ABNORMAL LOW (ref 80.0–100.0)
Monocytes Absolute: 0.4 10*3/uL (ref 0.1–1.0)
Monocytes Relative: 7 %
Neutro Abs: 3.9 10*3/uL (ref 1.7–7.7)
Neutrophils Relative %: 71 %
Platelets: 274 10*3/uL (ref 150–400)
RBC: 4.13 MIL/uL (ref 3.87–5.11)
RDW: 18 % — ABNORMAL HIGH (ref 11.5–15.5)
WBC: 5.4 10*3/uL (ref 4.0–10.5)
nRBC: 0 % (ref 0.0–0.2)

## 2022-04-05 LAB — RAD ONC ARIA SESSION SUMMARY
Course Elapsed Days: 27
Plan Fractions Treated to Date: 15
Plan Prescribed Dose Per Fraction: 1.8 Gy
Plan Total Fractions Prescribed: 30
Plan Total Prescribed Dose: 54 Gy
Reference Point Dosage Given to Date: 27 Gy
Reference Point Session Dosage Given: 1.8 Gy
Session Number: 15

## 2022-04-05 MED ORDER — LOPERAMIDE HCL 2 MG PO CAPS: 2.0000 mg | ORAL_CAPSULE | ORAL | 0 refills | Status: DC

## 2022-04-05 MED ORDER — SODIUM CHLORIDE 0.9 % IV SOLN
Freq: Once | INTRAVENOUS | Status: DC
  Filled 2022-04-05: qty 250

## 2022-04-05 MED ORDER — SODIUM CHLORIDE 0.9 % IV SOLN
200.0000 mg | Freq: Once | INTRAVENOUS | Status: AC
  Administered 2022-04-05: 200 mg via INTRAVENOUS
  Filled 2022-04-05: qty 200

## 2022-04-05 MED ORDER — SODIUM CHLORIDE 0.9 % IV SOLN
7400.0000 mg | INTRAVENOUS | Status: DC
  Administered 2022-04-05: 7400 mg via INTRAVENOUS
  Filled 2022-04-05: qty 148

## 2022-04-05 MED ORDER — SODIUM CHLORIDE 0.9 % IV SOLN
Freq: Once | INTRAVENOUS | Status: AC
  Filled 2022-04-05: qty 250

## 2022-04-05 MED ORDER — MITOMYCIN CHEMO IV INJECTION 20 MG
18.0000 mg | Freq: Once | INTRAVENOUS | Status: AC
  Administered 2022-04-05: 18 mg via INTRAVENOUS
  Filled 2022-04-05: qty 36

## 2022-04-05 MED ORDER — PROCHLORPERAZINE MALEATE 10 MG PO TABS
10.0000 mg | ORAL_TABLET | Freq: Once | ORAL | Status: AC
  Administered 2022-04-05: 10 mg via ORAL
  Filled 2022-04-05: qty 1

## 2022-04-05 NOTE — Assessment & Plan Note (Signed)
Non specific. CT also showed patchy consolidation of right lung, clinically patient denies any sob, chest pain or cough.  repeat CT In May 2024 to follow up

## 2022-04-05 NOTE — Progress Notes (Signed)
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  Discussed with surgery Dr.White.  Labs are reviewed and discussed with patient. S/p D1 5-FU/mitomycin C  Follow up with radiation oncology for concurrent radiation.  Proceed with 5-FU/mitomycin C  Recommend patient to follow up with Dr.White for examination 8-12 weeks after radiation.   IDA (iron deficiency anemia) Finish IV venofer weekly x 4   Lung nodules Non specific. CT also showed patchy consolidation of right lung, clinically patient denies any sob, chest pain or cough.  repeat CT In May 2024 to follow up   Diarrhea Due to radiation and chemotherapy Recommend imodium PRN as instructed. Rx sent.    Perianal irritation Due radiation side effects.  Topical steroid PRN   Follow up  in May 2024. Patient knows to call clinic if she experiences any worsening of her symptoms   All questions were answered. The patient knows to call the clinic with any problems, questions or concerns.  Earlie Server, MD, PhD [facility] Hematology Oncology 04/05/2022     HISTORY OF PRESENTING ILLNESS:  Cheryl Goodwin 62 y.o. female presents to establish care for Anal squamous carcinoma I have reviewed her chart and materials related to her cancer extensively and collaborated history with the patient. 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  Denies any difficulty passing bowel movements, blood in the stool.  01/18/2022, colonoscopy showed anal mass 0 to 1 cm from the anal verge.  Likely malignant tumor in the distal rectum, biopsied.  Nonbleeding internal hemorrhoids.   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  Superficial biopsies, invasion cannot be accessed.   01/26/2022 Cancer Staging   Staging form: Anus, AJCC V9 - Clinical stage from 01/26/2022: Stage IIIC (cT4, cN1, cM0) - Signed by Earlie Server, MD on 02/06/2022 Stage prefix: Initial diagnosis   01/29/2022 Imaging   CT chest abdomen w contrast  1. No evidence of metastatic disease in the abdomen. 2. No definite findings of metastatic disease in the chest. Two solid pulmonary nodules, largest 0.5 cm in the right lower lobe.Recommend attention on follow-up chest CT in 3 months. 3. Mild patchy consolidation in the posterior right upper lobe with mild to moderate patchy ground-glass opacities in the right upper,right middle and right lower lobes, most suggestive of a mild multilobar pneumonia. 4. Moderate hiatal hernia. 5. Moderate to severe L1 vertebral compression fracture, chronic appearing.   02/03/2022 Imaging   MR pelvis wo contrast 1. Anorectal mass extending from the anterior and lateral LEFT low rectum through the anal canal and involving the LEFT sphincter complex to include the intersphincteric plane with abutment and potential early involvement of external sphincter. Also with suspicion for early encroachment upon the lower rectovaginal plane with extension towards  the lower vagina. 2. Signs of LEFT inguinal nodal disease. Other small irregular lymph nodes particularly on the LEFT suspicious with a single necrotic appearing node that is highly  suspicious for if not diagnostic of inguinal nodal involvement. Note that the full pelvis is not assessed on this anorectal focused evaluation. No adenopathy seen along the pelvic sidewalls or about the mesorectum.      02/11/2022 Procedure   Medi port placed by IR   03/08/2022 -  Chemotherapy   Patient is on Treatment Plan : ANUS Mitomycin D1,28 + 5FU D1-4, 28-31 q32d      Patient is widowed and current not sexually active. Denies smoking or alcohol use. She has an upcoming gynecology appointment.  Denies any abdominal pain, cough, shortness of breath nausea vomiting.  Denies any blood in the stool.    INTERVAL HISTORY Cheryl Goodwin is a 62 y.o. female who has above history reviewed by me today presents for follow up visit for anal cancer and iron deficiency anemia.  Patient is on radiation.  Overall she tolerates well. She has experienced rectal/anal burning, and intermittent diarrhea, about twice daily.  No fever, chills, abdominal pain.     MEDICAL HISTORY:  Past Medical History:  Diagnosis Date   Depression    GERD (gastroesophageal reflux disease)    Hyperlipidemia     SURGICAL HISTORY: Past Surgical History:  Procedure Laterality Date   AUGMENTATION MAMMAPLASTY Bilateral 2005   with lift   BACK SURGERY  09/24/2014   L1 fracture, [facility]   BREAST ENHANCEMENT SURGERY  2007   COLONOSCOPY  2013   ENDOMETRIAL ABLATION  2013   ESOPHAGOGASTRODUODENOSCOPY (EGD) WITH PROPOFOL N/A 07/08/2016   Procedure: ESOPHAGOGASTRODUODENOSCOPY (EGD) WITH PROPOFOL;  Surgeon: Lucilla Lame, MD;  Location: [place];  Service: Endoscopy;  Laterality: N/A;   IR IMAGING GUIDED PORT INSERTION  02/11/2022   STOMACH SURGERY  2006   "Tummy Tuck"   TUBAL LIGATION      SOCIAL HISTORY: Social History   Socioeconomic History   Marital status: Widowed    Spouse name: Not on file   Number of children: 2   Years of education: Not on file   Highest education level: Not on file   Occupational History   Occupation: Unemployed  Tobacco Use   Smoking status: Never   Smokeless tobacco: Never  Substance and Sexual Activity   Alcohol use: Yes    Comment: 10 beers/month   Drug use: No   Sexual activity: Not on file  Other Topics Concern   Not on file  Social History Narrative   Not on file   Social Determinants of Health   Financial Resource Strain: Not on file  Food Insecurity: Not on file  Transportation Needs: Not on file  Physical Activity: Not on file  Stress: Not on file  Social Connections: Not on file  Intimate Partner Violence: Not on file    FAMILY HISTORY: Family History  Problem Relation Age of Onset   Stroke Mother    Breast cancer Mother    Emphysema Father        was a smoker   COPD Sister        was a smoker   Cancer Brother     ALLERGIES:  has No Known Allergies.  MEDICATIONS:  Current Outpatient Medications  Medication Sig Dispense Refill   buPROPion (WELLBUTRIN XL) 300 MG 24 hr tablet Take 300 mg by mouth daily.     esomeprazole (NEXIUM) 40 MG capsule  Take by mouth.     hydrocortisone cream 0.5 % Apply 1 Application topically 3 (three) times daily. 30 g 3   lidocaine-prilocaine (EMLA) cream Apply to affected area once 30 g 3   loperamide (IMODIUM) 2 MG capsule Take 1 capsule (2 mg total) by mouth See admin instructions. Initial: 4 mg, followed by 2 mg after each loose stool; maximum: 16 mg/day 30 capsule 0   lovastatin (MEVACOR) 20 MG tablet SMARTSIG:1 Tablet(s) By Mouth Every Evening     magic mouthwash (multi-ingredient) oral suspension Swish and swallow 5-10 mLs by mouth 4 (four) times daily as needed for mouth pain. 480 mL 1   magic mouthwash w/lidocaine SOLN Take 5 mLs by mouth 4 (four) times daily as needed for mouth pain. Sig: Swish/Swallow 5-10 ml four times a day as needed. Dispense 480 ml. 1RF 480 mL 1   omeprazole (PRILOSEC) 40 MG capsule Take 1 capsule (40 mg total) by mouth daily. 30 capsule 3   ondansetron  (ZOFRAN) 8 MG tablet Take 1 tablet (8 mg total) by mouth every 8 (eight) hours as needed for nausea or vomiting. 30 tablet 1   prochlorperazine (COMPAZINE) 10 MG tablet Take 1 tablet (10 mg total) by mouth every 6 (six) hours as needed for nausea or vomiting. 30 tablet 1   sertraline (ZOLOFT) 100 MG tablet Take 150 mg by mouth daily.     simvastatin (ZOCOR) 20 MG tablet Take 20 mg by mouth every evening.     traMADol (ULTRAM) 50 MG tablet Take 1 tablet (50 mg total) by mouth every 6 (six) hours as needed. 30 tablet 0   No current facility-administered medications for this visit.   Facility-Administered Medications Ordered in Other Visits  Medication Dose Route Frequency Provider Last Rate Last Admin   0.9 %  sodium chloride infusion   Intravenous Once Earlie Server, MD       fluorouracil (ADRUCIL) 7,400 mg in sodium chloride 0.9 % 102 mL chemo infusion  7,400 mg Intravenous 4 days Earlie Server, MD   Infusion Verify at 04/05/22 1628    Review of Systems  Constitutional:  Negative for appetite change, chills, fatigue and fever.  HENT:   Negative for hearing loss and voice change.   Eyes:  Negative for eye problems.  Respiratory:  Negative for chest tightness and cough.   Cardiovascular:  Negative for chest pain.  Gastrointestinal:  Negative for abdominal distention, abdominal pain and blood in stool.  Endocrine: Negative for hot flashes.  Genitourinary:  Negative for difficulty urinating and frequency.   Musculoskeletal:  Negative for arthralgias.  Skin:  Negative for itching and rash.  Neurological:  Negative for extremity weakness.  Hematological:  Negative for adenopathy.  Psychiatric/Behavioral:  Negative for confusion.      PHYSICAL EXAMINATION: ECOG PERFORMANCE STATUS: 0 - Asymptomatic  Vitals:   04/05/22 1314  BP: 117/75  Pulse: 89  Resp: 18  Temp: 98.3 F (36.8 C)   Filed Weights   04/05/22 1314  Weight: 147 lb 4.8 oz (66.8 kg)    Physical Exam Constitutional:       General: She is not in acute distress.    Appearance: She is not diaphoretic.  HENT:     Head: Normocephalic.     Nose: Nose normal.     Mouth/Throat:     Pharynx: No oropharyngeal exudate.  Eyes:     General: No scleral icterus.    Pupils: Pupils are equal, round, and reactive to light.  Cardiovascular:  Rate and Rhythm: Normal rate.     Heart sounds: No murmur heard. Pulmonary:     Effort: Pulmonary effort is normal. No respiratory distress.     Breath sounds: No wheezing.  Abdominal:     General: There is no distension.     Palpations: Abdomen is soft.     Tenderness: There is no abdominal tenderness.  Musculoskeletal:        General: Normal range of motion.     Cervical back: Normal range of motion and neck supple.  Skin:    General: Skin is warm and dry.     Findings: No erythema.  Neurological:     Mental Status: She is alert and oriented to person, place, and time.     Cranial Nerves: No cranial nerve deficit.     Motor: No abnormal muscle tone.     Coordination: Coordination normal.  Psychiatric:        Mood and Affect: Affect normal.      LABORATORY DATA:  I have reviewed the data as listed    Latest Ref Rng & Units 04/05/2022   12:51 PM 03/22/2022   10:25 AM 03/08/2022    2:35 PM  CBC  WBC 4.0 - 10.5 K/uL 5.4  2.8  6.4   Hemoglobin 12.0 - 15.0 g/dL 10.6  9.1  10.8   Hematocrit 36.0 - 46.0 % 31.8  26.6  32.9   Platelets 150 - 400 K/uL 274  119  376       Latest Ref Rng & Units 04/05/2022   12:51 PM 03/22/2022   10:25 AM 03/08/2022    2:35 PM  CMP  Glucose 70 - 99 mg/dL 127  101  101   BUN 6 - 20 mg/dL '10  7  10   '$ Creatinine 0.44 - 1.00 mg/dL 0.80  0.81  0.80   Sodium 135 - 145 mmol/L 132  135  132   Potassium 3.5 - 5.1 mmol/L 4.0  4.1  3.9   Chloride 98 - 111 mmol/L 98  100  100   CO2 22 - 32 mmol/L '23  26  23   '$ Calcium 8.9 - 10.3 mg/dL 8.9  8.6  8.9   Total Protein 6.5 - 8.1 g/dL 7.7  7.3  7.8   Total Bilirubin 0.3 - 1.2 mg/dL 0.2  0.2  0.4    Alkaline Phos 38 - 126 U/L 48  40  46   AST 15 - 41 U/L '26  21  23   '$ ALT 0 - 44 U/L '17  16  15      '$ RADIOGRAPHIC STUDIES: I have personally reviewed the radiological images as listed and agreed with the findings in the report. No results found.

## 2022-04-05 NOTE — Assessment & Plan Note (Signed)
Finish IV venofer weekly x 4

## 2022-04-05 NOTE — Assessment & Plan Note (Signed)
Due radiation side effects.  Topical steroid PRN

## 2022-04-05 NOTE — Assessment & Plan Note (Addendum)
Pathology and images were reviewed and discussed with patient. 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  Discussed with surgery Dr.White.  Labs are reviewed and discussed with patient. S/p D1 5-FU/mitomycin C  Follow up with radiation oncology for concurrent radiation.  Proceed with 5-FU/mitomycin C  Recommend patient to follow up with Dr.White for examination 8-12 weeks after radiation.

## 2022-04-05 NOTE — Progress Notes (Signed)
Pt here for follow up. Pt reports that she is having bowel incontinence. Also she states that she is having burning to radiated site.

## 2022-04-05 NOTE — Assessment & Plan Note (Addendum)
Due to radiation and chemotherapy Recommend imodium PRN as instructed. Rx sent.

## 2022-04-06 ENCOUNTER — Inpatient Hospital Stay: Payer: Medicaid Other

## 2022-04-06 ENCOUNTER — Other Ambulatory Visit: Payer: Self-pay

## 2022-04-06 ENCOUNTER — Ambulatory Visit
Admission: RE | Admit: 2022-04-06 | Discharge: 2022-04-06 | Disposition: A | Discharge status: Home / Self Care | Payer: Medicaid Other | Source: Ambulatory Visit | Attending: Radiation Oncology | Admitting: Radiation Oncology

## 2022-04-06 DIAGNOSIS — Z510 Encounter for antineoplastic radiation therapy: Secondary | ICD-10-CM | POA: Diagnosis not present

## 2022-04-06 LAB — RAD ONC ARIA SESSION SUMMARY
Course Elapsed Days: 28
Plan Fractions Treated to Date: 16
Plan Prescribed Dose Per Fraction: 1.8 Gy
Plan Total Fractions Prescribed: 30
Plan Total Prescribed Dose: 54 Gy
Reference Point Dosage Given to Date: 28.8 Gy
Reference Point Session Dosage Given: 1.8 Gy
Session Number: 16

## 2022-04-07 ENCOUNTER — Other Ambulatory Visit: Payer: Self-pay

## 2022-04-07 ENCOUNTER — Ambulatory Visit
Admission: RE | Admit: 2022-04-07 | Discharge: 2022-04-07 | Disposition: A | Discharge status: Home / Self Care | Payer: Medicaid Other | Source: Ambulatory Visit | Attending: Radiation Oncology | Admitting: Radiation Oncology

## 2022-04-07 DIAGNOSIS — Z510 Encounter for antineoplastic radiation therapy: Secondary | ICD-10-CM | POA: Diagnosis not present

## 2022-04-07 LAB — RAD ONC ARIA SESSION SUMMARY
Course Elapsed Days: 29
Plan Fractions Treated to Date: 17
Plan Prescribed Dose Per Fraction: 1.8 Gy
Plan Total Fractions Prescribed: 30
Plan Total Prescribed Dose: 54 Gy
Reference Point Dosage Given to Date: 30.6 Gy
Reference Point Session Dosage Given: 1.8 Gy
Session Number: 17

## 2022-04-08 ENCOUNTER — Ambulatory Visit
Admission: RE | Admit: 2022-04-08 | Discharge: 2022-04-08 | Disposition: A | Discharge status: Home / Self Care | Payer: Medicaid Other | Source: Ambulatory Visit | Attending: Radiation Oncology | Admitting: Radiation Oncology

## 2022-04-08 ENCOUNTER — Other Ambulatory Visit: Payer: Self-pay

## 2022-04-08 DIAGNOSIS — Z510 Encounter for antineoplastic radiation therapy: Secondary | ICD-10-CM | POA: Diagnosis not present

## 2022-04-08 LAB — RAD ONC ARIA SESSION SUMMARY
Course Elapsed Days: 30
Plan Fractions Treated to Date: 18
Plan Prescribed Dose Per Fraction: 1.8 Gy
Plan Total Fractions Prescribed: 30
Plan Total Prescribed Dose: 54 Gy
Reference Point Dosage Given to Date: 32.4 Gy
Reference Point Session Dosage Given: 1.8 Gy
Session Number: 18

## 2022-04-09 ENCOUNTER — Inpatient Hospital Stay: Payer: Medicaid Other

## 2022-04-09 ENCOUNTER — Other Ambulatory Visit: Payer: Self-pay

## 2022-04-09 ENCOUNTER — Ambulatory Visit
Admission: RE | Admit: 2022-04-09 | Discharge: 2022-04-09 | Disposition: A | Discharge status: Home / Self Care | Payer: Medicaid Other | Source: Ambulatory Visit | Attending: Radiation Oncology | Admitting: Radiation Oncology

## 2022-04-09 VITALS — BP 135/82 | HR 95 | Resp 18

## 2022-04-09 DIAGNOSIS — C210 Malignant neoplasm of anus, unspecified: Secondary | ICD-10-CM

## 2022-04-09 DIAGNOSIS — Z510 Encounter for antineoplastic radiation therapy: Secondary | ICD-10-CM | POA: Diagnosis not present

## 2022-04-09 LAB — RAD ONC ARIA SESSION SUMMARY
Course Elapsed Days: 31
Plan Fractions Treated to Date: 19
Plan Prescribed Dose Per Fraction: 1.8 Gy
Plan Total Fractions Prescribed: 30
Plan Total Prescribed Dose: 54 Gy
Reference Point Dosage Given to Date: 34.2 Gy
Reference Point Session Dosage Given: 1.8 Gy
Session Number: 19

## 2022-04-09 MED ORDER — HEPARIN SOD (PORK) LOCK FLUSH 100 UNIT/ML IV SOLN
500.0000 [IU] | Freq: Once | INTRAVENOUS | Status: AC | PRN
  Administered 2022-04-09: 500 [IU]
  Filled 2022-04-09: qty 5

## 2022-04-09 MED ORDER — SODIUM CHLORIDE 0.9% FLUSH
10.0000 mL | INTRAVENOUS | Status: DC | PRN
  Administered 2022-04-09: 10 mL
  Filled 2022-04-09: qty 10

## 2022-04-12 ENCOUNTER — Other Ambulatory Visit: Payer: Self-pay | Admitting: *Deleted

## 2022-04-12 ENCOUNTER — Other Ambulatory Visit: Payer: Self-pay

## 2022-04-12 ENCOUNTER — Ambulatory Visit
Admission: RE | Admit: 2022-04-12 | Discharge: 2022-04-12 | Disposition: A | Discharge status: Home / Self Care | Payer: Medicaid Other | Source: Ambulatory Visit | Attending: Radiation Oncology | Admitting: Radiation Oncology

## 2022-04-12 DIAGNOSIS — Z510 Encounter for antineoplastic radiation therapy: Secondary | ICD-10-CM | POA: Diagnosis not present

## 2022-04-12 LAB — RAD ONC ARIA SESSION SUMMARY
Course Elapsed Days: 34
Plan Fractions Treated to Date: 20
Plan Prescribed Dose Per Fraction: 1.8 Gy
Plan Total Fractions Prescribed: 30
Plan Total Prescribed Dose: 54 Gy
Reference Point Dosage Given to Date: 36 Gy
Reference Point Session Dosage Given: 1.8 Gy
Session Number: 20

## 2022-04-12 MED ORDER — SILVER SULFADIAZINE 1 % EX CREA: 1.0000 | TOPICAL_CREAM | Freq: Every day | CUTANEOUS | 0 refills | Status: DC

## 2022-04-13 ENCOUNTER — Ambulatory Visit: Payer: Medicaid Other

## 2022-04-14 ENCOUNTER — Ambulatory Visit: Payer: Medicaid Other

## 2022-04-15 ENCOUNTER — Ambulatory Visit: Payer: Medicaid Other

## 2022-04-15 ENCOUNTER — Inpatient Hospital Stay (HOSPITAL_BASED_OUTPATIENT_CLINIC_OR_DEPARTMENT_OTHER): Payer: Medicaid Other | Admitting: Nurse Practitioner

## 2022-04-15 ENCOUNTER — Ambulatory Visit: Admission: RE | Admit: 2022-04-15 | Payer: Medicaid Other | Source: Ambulatory Visit

## 2022-04-15 ENCOUNTER — Encounter: Payer: Self-pay | Admitting: Nurse Practitioner

## 2022-04-15 VITALS — BP 97/75 | HR 88 | Temp 97.6°F | Resp 20 | Wt 145.2 lb

## 2022-04-15 DIAGNOSIS — L580 Acute radiodermatitis: Secondary | ICD-10-CM | POA: Diagnosis not present

## 2022-04-15 DIAGNOSIS — R300 Dysuria: Secondary | ICD-10-CM

## 2022-04-15 DIAGNOSIS — Z510 Encounter for antineoplastic radiation therapy: Secondary | ICD-10-CM | POA: Diagnosis not present

## 2022-04-15 LAB — URINALYSIS, COMPLETE (UACMP) WITH MICROSCOPIC
Bacteria, UA: NONE SEEN
Bilirubin Urine: NEGATIVE
Glucose, UA: NEGATIVE mg/dL
Hgb urine dipstick: NEGATIVE
Ketones, ur: 5 mg/dL — AB
Nitrite: NEGATIVE
Protein, ur: 30 mg/dL — AB
Specific Gravity, Urine: 1.025 (ref 1.005–1.030)
Squamous Epithelial / HPF: NONE SEEN /HPF (ref 0–5)
WBC, UA: NONE SEEN WBC/hpf (ref 0–5)
pH: 5 (ref 5.0–8.0)

## 2022-04-15 MED ORDER — TRIAMCINOLONE ACETONIDE 0.1 % EX CREA: 1.0000 | TOPICAL_CREAM | Freq: Two times a day (BID) | CUTANEOUS | 0 refills | Status: DC

## 2022-04-15 MED ORDER — PREDNISONE 5 MG PO TABS: 10.0000 mg | ORAL_TABLET | Freq: Every day | ORAL | 0 refills | Status: AC

## 2022-04-15 NOTE — Patient Instructions (Addendum)
Clean area with warm to room temperature water at least twice a day and after each bowel movement. You can use a sitz bath for comfort daily. These are available for purchase over the counter. Apply topical triamcinolone cream to area two times a day as prescribed. This is a steroid and helps with inflammation. Once you have gently rubbed in steroid cream, apply barrier cream such as desitin or vaseline. If symptoms don't improve over the weekend, let me know. . You can purchase Ensure Juven which is an amino acid supplement to help with wound healing on amaz[employer]. Take 1-2 packets per day.  It was a pleasure meeting you today and thank you for allowing me to participate in your care. -Beckey Rutter, NP

## 2022-04-15 NOTE — Progress Notes (Signed)
[facility]pt[facility]  [facility] [address], [address] [place], [place] [address] [phone] (fax)  Patient Care Team: Cheryl GoodellFeldpausch, Dale E, MD as PCP - General (Family Medicine) Jim LikeLambert, Sheena M, RN as Registered Nurse Scarlett PrestoShaver, Anne F, RN (Inactive) as Registered Nurse Benita GutterStanton, Kristi D, RN as Oncology Nurse Navigator   Name of the patient: Cheryl GoodellShelby Delange  [id number]  Jul 13, 1960   Date of visit: 04/15/22  Diagnosis- Anal Squamous Cell Carcinoma  Chief complaint/ Reason for visit- Dysuria, Rectal Discharge & Pain  Heme/Onc history:  Oncology History  Anal squamous cell carcinoma (HCC)  01/18/2022 Procedure   Patient self palpated rectal/anal mass.  Denies any difficulty passing bowel movements, blood in the stool.  01/18/2022, colonoscopy showed anal mass 0 to 1 cm from the anal verge.  Likely malignant tumor in the distal rectum, biopsied.  Nonbleeding internal hemorrhoids.   01/26/2022 Initial Diagnosis   Anal squamous cell carcinoma (HCC)  -Biopsy of the distal rectum mass showed keratinizing moderately to poorly differentiated squamous cell carcinoma at least in situ.  Superficial biopsies, invasion cannot be accessed.   01/26/2022 Cancer Staging   Staging form: Anus, AJCC V9 - Clinical stage from 01/26/2022: Stage IIIC (cT4, cN1, cM0) - Signed by Rickard PatienceYu, Zhou, MD on 02/06/2022 Stage prefix: Initial diagnosis   01/29/2022 Imaging   CT chest abdomen w contrast  1. No evidence of metastatic disease in the abdomen. 2. No definite findings of metastatic disease in the chest. Two solid pulmonary nodules, largest 0.5 cm in the right lower lobe.Recommend attention on follow-up chest CT in 3 months. 3. Mild patchy consolidation in the posterior right upper lobe with mild to moderate patchy ground-glass opacities in the right upper,right middle and right lower  lobes, most suggestive of a mild multilobar pneumonia. 4. Moderate hiatal hernia. 5. Moderate to severe L1 vertebral compression fracture, chronic appearing.   02/03/2022 Imaging   MR pelvis wo contrast 1. Anorectal mass extending from the anterior and lateral LEFT low rectum through the anal canal and involving the LEFT sphincter complex to include the intersphincteric plane with abutment and potential early involvement of external sphincter. Also with suspicion for early encroachment upon the lower rectovaginal plane with extension towards the lower vagina. 2. Signs of LEFT inguinal nodal disease. Other small irregular lymph nodes particularly on the LEFT suspicious with a single necrotic appearing node that is highly suspicious for if not diagnostic of inguinal nodal involvement. Note that the full pelvis is not assessed on this anorectal focused evaluation. No adenopathy seen along the pelvic sidewalls or about the mesorectum.      02/11/2022 Procedure   Medi port placed by IR   03/08/2022 -  Chemotherapy   Patient is on Treatment Plan : ANUS Mitomycin D1,28 + 5FU D1-4, 28-31 q32d       Interval history- Patient is 62 year old female diagnosed with anal cancer, currently undergoing concurrent chemotherapy with 5-FU and mitocycin c with radiation who presents to [facility] for anal irritation, discharge, and pain. She has increased fecal incontinence. Complains of urinary urgency. No dysuria. Skin is raw. She's using topical otc hydrocortisone cream but no relief. No fevers, chills. Is taking imodium for diarrhea.   ECOG FS:1 - Symptomatic but completely ambulatory  Review of systems- Review of Systems  Constitutional:  Positive for malaise/fatigue. Negative for chills, fever and weight loss.  Respiratory:  Negative for cough, hemoptysis, shortness of breath and wheezing.   Cardiovascular:  Negative for chest pain, palpitations and leg swelling.  Gastrointestinal:   Positive for diarrhea. Negative for abdominal pain, blood in stool, constipation, melena, nausea and vomiting.  Genitourinary:  Positive for dysuria and urgency. Negative for flank pain and hematuria.  Musculoskeletal:  Negative for back pain, falls, joint pain and myalgias.  Skin:  Negative for itching and rash.  Neurological:  Negative for dizziness, tingling, sensory change, loss of consciousness, weakness and headaches.  Endo/Heme/Allergies:  Negative for environmental allergies. Does not bruise/bleed easily.  Psychiatric/Behavioral:  Negative for depression. The patient is not nervous/anxious and does not have insomnia.     No Known Allergies  Past Medical History:  Diagnosis Date   Depression    GERD (gastroesophageal reflux disease)    Hyperlipidemia    Past Surgical History:  Procedure Laterality Date   AUGMENTATION MAMMAPLASTY Bilateral 2005   with lift   BACK SURGERY  09/24/2014   L1 fracture, [facility]   BREAST ENHANCEMENT SURGERY  2007   COLONOSCOPY  2013   ENDOMETRIAL ABLATION  2013   ESOPHAGOGASTRODUODENOSCOPY (EGD) WITH PROPOFOL N/A 07/08/2016   Procedure: ESOPHAGOGASTRODUODENOSCOPY (EGD) WITH PROPOFOL;  Surgeon: Midge Minium, MD;  Location: [facility];  Service: Endoscopy;  Laterality: N/A;   IR IMAGING GUIDED PORT INSERTION  02/11/2022   STOMACH SURGERY  2006   "Tummy Tuck"   TUBAL LIGATION     Social History   Socioeconomic History   Marital status: Widowed    Spouse name: Not on file   Number of children: 2   Years of education: Not on file   Highest education level: Not on file  Occupational History   Occupation: Unemployed  Tobacco Use   Smoking status: Never   Smokeless tobacco: Never  Substance and Sexual Activity   Alcohol use: Yes    Comment: 10 beers/month   Drug use: No   Sexual activity: Not on file  Other Topics Concern   Not on file  Social History Narrative   Not on file   Social Determinants of Health   Financial Resource  Strain: Not on file  Food Insecurity: Not on file  Transportation Needs: Not on file  Physical Activity: Not on file  Stress: Not on file  Social Connections: Not on file  Intimate Partner Violence: Not on file   Family History  Problem Relation Age of Onset   Stroke Mother    Breast cancer Mother    Emphysema Father        was a smoker   COPD Sister        was a smoker   Cancer Brother     Current Outpatient Medications:    buPROPion (WELLBUTRIN XL) 300 MG 24 hr tablet, Take 300 mg by mouth daily., Disp: , Rfl:    esomeprazole (NEXIUM) 40 MG capsule, Take by mouth., Disp: , Rfl:    hydrocortisone cream 0.5 %, Apply 1 Application topically 3 (three) times daily., Disp: 30 g, Rfl: 3   lidocaine-prilocaine (EMLA) cream, Apply to affected area once, Disp: 30 g, Rfl: 3   loperamide (IMODIUM) 2 MG capsule, Take 1 capsule (2 mg total) by mouth See admin instructions. Initial: 4 mg, followed by 2 mg after each loose stool; maximum: 16 mg/day, Disp: 30 capsule, Rfl: 0   lovastatin (MEVACOR) 20 MG tablet, SMARTSIG:1 Tablet(s) By Mouth Every Evening, Disp: , Rfl:    magic mouthwash (multi-ingredient) oral suspension, Swish  and swallow 5-10 mLs by mouth 4 (four) times daily as needed for mouth pain., Disp: 480 mL, Rfl: 1   magic mouthwash w/lidocaine SOLN, Take 5 mLs by mouth 4 (four) times daily as needed for mouth pain. Sig: Swish/Swallow 5-10 ml four times a day as needed. Dispense 480 ml. 1RF, Disp: 480 mL, Rfl: 1   omeprazole (PRILOSEC) 40 MG capsule, Take 1 capsule (40 mg total) by mouth daily., Disp: 30 capsule, Rfl: 3   ondansetron (ZOFRAN) 8 MG tablet, Take 1 tablet (8 mg total) by mouth every 8 (eight) hours as needed for nausea or vomiting., Disp: 30 tablet, Rfl: 1   prochlorperazine (COMPAZINE) 10 MG tablet, Take 1 tablet (10 mg total) by mouth every 6 (six) hours as needed for nausea or vomiting., Disp: 30 tablet, Rfl: 1   sertraline (ZOLOFT) 100 MG tablet, Take 150 mg by mouth  daily., Disp: , Rfl:    silver sulfADIAZINE (SILVADENE) 1 % cream, Apply 1 Application topically daily., Disp: 50 g, Rfl: 0   simvastatin (ZOCOR) 20 MG tablet, Take 20 mg by mouth every evening., Disp: , Rfl:    traMADol (ULTRAM) 50 MG tablet, Take 1 tablet (50 mg total) by mouth every 6 (six) hours as needed., Disp: 30 tablet, Rfl: 0  Physical exam:  Vitals:   04/15/22 1014  BP: 97/75  Pulse: 88  Resp: 20  Temp: 97.6 F (36.4 C)  SpO2: 100%  Weight: 145 lb 3.2 oz (65.9 kg)   Physical Exam Vitals reviewed.  Abdominal:     General: There is no distension.     Palpations: Abdomen is soft.     Tenderness: There is no abdominal tenderness. There is no guarding.  Genitourinary:    Comments: Watery stool present. Perianal skin irritation but no skin openings.  Neurological:     Mental Status: She is alert.  Psychiatric:        Mood and Affect: Mood normal.        Behavior: Behavior normal.         Latest Ref Rng & Units 04/05/2022   12:51 PM  CMP  Glucose 70 - 99 mg/dL 161127   BUN 6 - 20 mg/dL 10   Creatinine 0.960.44 - 1.00 mg/dL 0.450.80   Sodium 409135 - 811145 mmol/L 132   Potassium 3.5 - 5.1 mmol/L 4.0   Chloride 98 - 111 mmol/L 98   CO2 22 - 32 mmol/L 23   Calcium 8.9 - 10.3 mg/dL 8.9   Total Protein 6.5 - 8.1 g/dL 7.7   Total Bilirubin 0.3 - 1.2 mg/dL 0.2   Alkaline Phos 38 - 126 U/L 48   AST 15 - 41 U/L 26   ALT 0 - 44 U/L 17       Latest Ref Rng & Units 04/05/2022   12:51 PM  CBC  WBC 4.0 - 10.5 K/uL 5.4   Hemoglobin 12.0 - 15.0 g/dL 91.410.6   Hematocrit 78.236.0 - 46.0 % 31.8   Platelets 150 - 400 K/uL 274    No results found.  Assessment and plan- Patient is a 62 y.o. female diagnosed with anal cancer, currently undergoing chemotherapy and concurrent radiation who presents to Symptom Management for acute complaints of:    Dysuria- suspect related to radiation. UA and culture not consistent with infection though she's high risk for infection with ongoing diarrhea and  immunocompromise d/t chemotherapy.  Radiation dermatitis- Skin changes d/t radiation- reviewed hygiene. Can use sitz bath for comfort. Start  topical triamcinolone twice a day for inflammation. Stop hydrocortisone. Apply barrier cream such as desitin or vaseline. I suspect the discharge is d/t radiation and may persist. Start prednisone 10 mg daily x 5 days for pain associated with radiation. She can also consider started amino acid supplement such as Ensure Juven which may help with skin healing with radiation.  Diarrhea- secondary to chemotherapy and radiation. Maximize imodium, 16 mg/day Pruritus- can consider hydroxyzine if needed.  Anal cancer- started radiation 03/08/22 with concurrent 5-FU & mitomycin B, currently s/p cycle 1. Continues radiation through roughly end of this month.   Disposition:  Return to symptoms if symptoms worsen or fail to improve. Otherwise, follow up with Dr Rushie Chestnut and Dr Cathie Hoops as scheduled.   Visit Diagnosis 1. Acute radiation dermatitis   2. Dysuria    Patient expressed understanding and was in agreement with this plan. She also understands that She can call clinic at any time with any questions, concerns, or complaints.   I discussed the assessment and treatment plan with the patient. The patient was provided an opportunity to ask questions and all were answered. The patient agreed with the plan and demonstrated an understanding of the instructions.   The patient was advised to call back or seek an in-person evaluation if the symptoms worsen or if the condition fails to improve as anticipated.   I spent 35 minutes face-to-face visit time dedicated to the care of this patient on the date of this encounter to including pre-visit review of med-onc notes and rad-onc notes, face-to-face time with the patient, and post visit ordering of testing/documentation.   Thank you for allowing me to participate in the care of this very pleasant patient.   Consuello Masse, DNP,  AGNP-C, AOCNP [facility] [phone]

## 2022-04-15 NOTE — Progress Notes (Signed)
Patient states her "Bottom area" is raw and swore. Patient states she is having some bleeding, peeling, and oozing (Discharge like). Patient states this is soaking her panties. Patient states she is now currently wearing a diaper because sometimes she has no cocntrol over her bowel movements. Patient states this week she been having the issues with feeling like she has to urinate but can't. Patient states she also has had some burning with urinating  but believes it because she is so raw.

## 2022-04-15 NOTE — Telephone Encounter (Signed)
FYI, pt wondering if she has a UTI.

## 2022-04-16 ENCOUNTER — Ambulatory Visit: Payer: Medicaid Other

## 2022-04-18 ENCOUNTER — Other Ambulatory Visit: Payer: Self-pay

## 2022-04-19 ENCOUNTER — Other Ambulatory Visit: Payer: Self-pay

## 2022-04-19 ENCOUNTER — Ambulatory Visit: Payer: Medicaid Other

## 2022-04-19 ENCOUNTER — Ambulatory Visit
Admission: RE | Admit: 2022-04-19 | Discharge: 2022-04-19 | Disposition: A | Discharge status: Home / Self Care | Payer: Medicaid Other | Source: Ambulatory Visit | Attending: Radiation Oncology | Admitting: Radiation Oncology

## 2022-04-19 DIAGNOSIS — Z510 Encounter for antineoplastic radiation therapy: Secondary | ICD-10-CM | POA: Diagnosis not present

## 2022-04-19 LAB — RAD ONC ARIA SESSION SUMMARY
Course Elapsed Days: 41
Plan Fractions Treated to Date: 21
Plan Prescribed Dose Per Fraction: 1.8 Gy
Plan Total Fractions Prescribed: 30
Plan Total Prescribed Dose: 54 Gy
Reference Point Dosage Given to Date: 37.8 Gy
Reference Point Session Dosage Given: 1.8 Gy
Session Number: 21

## 2022-04-20 ENCOUNTER — Encounter: Payer: Self-pay | Admitting: Hospice and Palliative Medicine

## 2022-04-20 ENCOUNTER — Other Ambulatory Visit: Payer: Self-pay | Admitting: Hospice and Palliative Medicine

## 2022-04-20 ENCOUNTER — Other Ambulatory Visit: Payer: Self-pay

## 2022-04-20 ENCOUNTER — Inpatient Hospital Stay (HOSPITAL_BASED_OUTPATIENT_CLINIC_OR_DEPARTMENT_OTHER): Payer: Medicaid Other | Admitting: Hospice and Palliative Medicine

## 2022-04-20 ENCOUNTER — Inpatient Hospital Stay: Payer: Medicaid Other

## 2022-04-20 ENCOUNTER — Ambulatory Visit
Admission: RE | Admit: 2022-04-20 | Discharge: 2022-04-20 | Disposition: A | Discharge status: Home / Self Care | Payer: Medicaid Other | Source: Ambulatory Visit | Attending: Radiation Oncology | Admitting: Radiation Oncology

## 2022-04-20 VITALS — BP 108/74 | HR 85 | Temp 98.4°F | Resp 20 | Ht 63.0 in | Wt 146.2 lb

## 2022-04-20 DIAGNOSIS — Z85048 Personal history of other malignant neoplasm of rectum, rectosigmoid junction, and anus: Secondary | ICD-10-CM | POA: Diagnosis not present

## 2022-04-20 DIAGNOSIS — B999 Unspecified infectious disease: Secondary | ICD-10-CM

## 2022-04-20 DIAGNOSIS — Z510 Encounter for antineoplastic radiation therapy: Secondary | ICD-10-CM | POA: Diagnosis not present

## 2022-04-20 DIAGNOSIS — T80219A Unspecified infection due to central venous catheter, initial encounter: Secondary | ICD-10-CM

## 2022-04-20 LAB — RAD ONC ARIA SESSION SUMMARY
Course Elapsed Days: 42
Plan Fractions Treated to Date: 22
Plan Prescribed Dose Per Fraction: 1.8 Gy
Plan Total Fractions Prescribed: 30
Plan Total Prescribed Dose: 54 Gy
Reference Point Dosage Given to Date: 39.6 Gy
Reference Point Session Dosage Given: 1.8 Gy
Session Number: 22

## 2022-04-20 LAB — CMP (CANCER CENTER ONLY)
ALT: 15 U/L (ref 0–44)
AST: 18 U/L (ref 15–41)
Albumin: 4.1 g/dL (ref 3.5–5.0)
Alkaline Phosphatase: 52 U/L (ref 38–126)
Anion gap: 12 (ref 5–15)
BUN: 10 mg/dL (ref 6–20)
CO2: 25 mmol/L (ref 22–32)
Calcium: 8.8 mg/dL — ABNORMAL LOW (ref 8.9–10.3)
Chloride: 97 mmol/L — ABNORMAL LOW (ref 98–111)
Creatinine: 0.75 mg/dL (ref 0.44–1.00)
GFR, Estimated: >60 mL/min (ref >60)
Glucose, Bld: 92 mg/dL (ref 70–99)
Potassium: 3.5 mmol/L (ref 3.5–5.1)
Sodium: 134 mmol/L — ABNORMAL LOW (ref 135–145)
Total Bilirubin: 0.6 mg/dL (ref 0.3–1.2)
Total Protein: 8.1 g/dL (ref 6.5–8.1)

## 2022-04-20 LAB — CBC WITH DIFFERENTIAL (CANCER CENTER ONLY)
Abs Immature Granulocytes: 0.04 10*3/uL (ref 0.00–0.07)
Basophils Absolute: 0 10*3/uL (ref 0.0–0.1)
Basophils Relative: 1 %
Eosinophils Absolute: 0.1 10*3/uL (ref 0.0–0.5)
Eosinophils Relative: 3 %
HCT: 28.6 % — ABNORMAL LOW (ref 36.0–46.0)
Hemoglobin: 9.4 g/dL — ABNORMAL LOW (ref 12.0–15.0)
Immature Granulocytes: 1 %
Lymphocytes Relative: 12 %
Lymphs Abs: 0.4 10*3/uL — ABNORMAL LOW (ref 0.7–4.0)
MCH: 26.9 pg (ref 26.0–34.0)
MCHC: 32.9 g/dL (ref 30.0–36.0)
MCV: 81.7 fL (ref 80.0–100.0)
Monocytes Absolute: 0.5 10*3/uL (ref 0.1–1.0)
Monocytes Relative: 15 %
Neutro Abs: 2.4 10*3/uL (ref 1.7–7.7)
Neutrophils Relative %: 68 %
Platelet Count: 144 10*3/uL — ABNORMAL LOW (ref 150–400)
RBC: 3.5 MIL/uL — ABNORMAL LOW (ref 3.87–5.11)
RDW: 21.2 % — ABNORMAL HIGH (ref 11.5–15.5)
WBC Count: 3.5 10*3/uL — ABNORMAL LOW (ref 4.0–10.5)
nRBC: 0 % (ref 0.0–0.2)

## 2022-04-20 LAB — URINE CULTURE: Culture: <10000 — AB

## 2022-04-20 MED ORDER — SULFAMETHOXAZOLE-TRIMETHOPRIM 800-160 MG PO TABS: 1.0000 | ORAL_TABLET | Freq: Two times a day (BID) | ORAL | 0 refills | Status: DC

## 2022-04-20 NOTE — Telephone Encounter (Signed)
Pt evaluated in smc today

## 2022-04-20 NOTE — Progress Notes (Signed)
Patient for IR Port Removal Wed 04/21/22, I called and spoke with the patient on the phone and gave pre-procedure instructions.  Pt was made aware to be here at 12:30p at the new entrance, NPO after MN prior to procedure as well as driver post procedure/recovery/discharge. Pt stated understanding.  Called 04/20/2022

## 2022-04-20 NOTE — Progress Notes (Signed)
Symptom Management [place] at [facility] [phone] [phone]  Patient Care Team: Sofie Hartigan, MD as PCP - General (Family Medicine) Rico Junker, RN as Registered Nurse Theodore Demark, RN (Inactive) as Registered Nurse Clent Jacks, RN as Oncology Nurse Navigator   NAME OF PATIENT: Cheryl Goodwin  [id number]  [birth year]   DATE OF VISIT: 04/20/22  REASON FOR CONSULT: Beza Bartley is a 62 y.o. female with multiple medical problems including stage III anal squamous cell carcinoma.  She is on treatment with 5-FU/mitomycin C.  INTERVAL HISTORY: Patient was sent to [facility] by radiation oncology for note of a red/warm port concerning for possible infection. Patient had port placed by IR on 02/11/2022.  Patient presents [facility] today with concern for an infected port.  She noticed that the port was painful last night and this morning she noticed that it was red and warm to touch.  It remains painful.  She has not noticed any purulent discharge or drainage.  No rashes elsewhere.  No chest pain or shortness of breath.  Denies fever or chills.  Denies any neurologic complaints. Denies recent fevers or illnesses. Denies any easy bleeding or bruising. Reports good appetite and denies weight loss. Denies chest pain. Denies any nausea, vomiting, constipation, or diarrhea. Denies urinary complaints. Patient offers no further specific complaints today.   PAST MEDICAL HISTORY: Past Medical History:  Diagnosis Date   Depression    GERD (gastroesophageal reflux disease)    Hyperlipidemia     PAST SURGICAL HISTORY:  Past Surgical History:  Procedure Laterality Date   AUGMENTATION MAMMAPLASTY Bilateral 2005   with lift   BACK SURGERY  09/24/2014   L1 fracture, [facility]   BREAST ENHANCEMENT SURGERY  2007   COLONOSCOPY  2013   ENDOMETRIAL ABLATION  2013   ESOPHAGOGASTRODUODENOSCOPY (EGD) WITH PROPOFOL N/A 07/08/2016   Procedure:  ESOPHAGOGASTRODUODENOSCOPY (EGD) WITH PROPOFOL;  Surgeon: Lucilla Lame, MD;  Location: [place];  Service: Endoscopy;  Laterality: N/A;   IR IMAGING GUIDED PORT INSERTION  02/11/2022   STOMACH SURGERY  2006   "Tummy Tuck"   TUBAL LIGATION      HEMATOLOGY/ONCOLOGY HISTORY:  Oncology History  Anal squamous cell carcinoma ([place])  01/18/2022 Procedure   Patient self palpated rectal/anal mass.  Denies any difficulty passing bowel movements, blood in the stool.  01/18/2022, colonoscopy showed anal mass 0 to 1 cm from the anal verge.  Likely malignant tumor in the distal rectum, biopsied.  Nonbleeding internal hemorrhoids.   01/26/2022 Initial Diagnosis   Anal squamous cell carcinoma ([place])  -Biopsy of the distal rectum mass showed keratinizing moderately to poorly differentiated squamous cell carcinoma at least in situ.  Superficial biopsies, invasion cannot be accessed.   01/26/2022 Cancer Staging   Staging form: Anus, AJCC V9 - Clinical stage from 01/26/2022: Stage IIIC (cT4, cN1, cM0) - Signed by Earlie Server, MD on 02/06/2022 Stage prefix: Initial diagnosis   01/29/2022 Imaging   CT chest abdomen w contrast  1. No evidence of metastatic disease in the abdomen. 2. No definite findings of metastatic disease in the chest. Two solid pulmonary nodules, largest 0.5 cm in the right lower lobe.Recommend attention on follow-up chest CT in 3 months. 3. Mild patchy consolidation in the posterior right upper lobe with mild to moderate patchy ground-glass opacities in the right upper,right middle and right lower lobes, most suggestive of a mild multilobar pneumonia. 4. Moderate hiatal hernia. 5. Moderate to severe L1  vertebral compression fracture, chronic appearing.   02/03/2022 Imaging   MR pelvis wo contrast 1. Anorectal mass extending from the anterior and lateral LEFT low rectum through the anal canal and involving the LEFT sphincter complex to include the intersphincteric plane with  abutment and potential early involvement of external sphincter. Also with suspicion for early encroachment upon the lower rectovaginal plane with extension towards the lower vagina. 2. Signs of LEFT inguinal nodal disease. Other small irregular lymph nodes particularly on the LEFT suspicious with a single necrotic appearing node that is highly suspicious for if not diagnostic of inguinal nodal involvement. Note that the full pelvis is not assessed on this anorectal focused evaluation. No adenopathy seen along the pelvic sidewalls or about the mesorectum.      02/11/2022 Procedure   Medi port placed by IR   03/08/2022 -  Chemotherapy   Patient is on Treatment Plan : ANUS Mitomycin D1,28 + 5FU D1-4, 28-31 q32d       ALLERGIES:  has No Known Allergies.  MEDICATIONS:  Current Outpatient Medications  Medication Sig Dispense Refill   buPROPion (WELLBUTRIN XL) 300 MG 24 hr tablet Take 300 mg by mouth daily.     esomeprazole (NEXIUM) 40 MG capsule Take by mouth.     lidocaine-prilocaine (EMLA) cream Apply to affected area once 30 g 3   loperamide (IMODIUM) 2 MG capsule Take 1 capsule (2 mg total) by mouth See admin instructions. Initial: 4 mg, followed by 2 mg after each loose stool; maximum: 16 mg/day 30 capsule 0   lovastatin (MEVACOR) 20 MG tablet SMARTSIG:1 Tablet(s) By Mouth Every Evening     magic mouthwash (multi-ingredient) oral suspension Swish and swallow 5-10 mLs by mouth 4 (four) times daily as needed for mouth pain. 480 mL 1   magic mouthwash w/lidocaine SOLN Take 5 mLs by mouth 4 (four) times daily as needed for mouth pain. Sig: Swish/Swallow 5-10 ml four times a day as needed. Dispense 480 ml. 1RF 480 mL 1   omeprazole (PRILOSEC) 40 MG capsule Take 1 capsule (40 mg total) by mouth daily. 30 capsule 3   ondansetron (ZOFRAN) 8 MG tablet Take 1 tablet (8 mg total) by mouth every 8 (eight) hours as needed for nausea or vomiting. 30 tablet 1   predniSONE (DELTASONE) 5 MG tablet Take 2  tablets (10 mg total) by mouth daily with breakfast for 5 days. For inflammation and pain associated with radiation 10 tablet 0   prochlorperazine (COMPAZINE) 10 MG tablet Take 1 tablet (10 mg total) by mouth every 6 (six) hours as needed for nausea or vomiting. 30 tablet 1   sertraline (ZOLOFT) 100 MG tablet Take 150 mg by mouth daily.     silver sulfADIAZINE (SILVADENE) 1 % cream Apply 1 Application topically daily. 50 g 0   simvastatin (ZOCOR) 20 MG tablet Take 20 mg by mouth every evening.     traMADol (ULTRAM) 50 MG tablet Take 1 tablet (50 mg total) by mouth every 6 (six) hours as needed. 30 tablet 0   triamcinolone cream (KENALOG) 0.1 % Apply 1 Application topically 2 (two) times daily. To area affected by radiation 80 g 0   No current facility-administered medications for this visit.    VITAL SIGNS: There were no vitals taken for this visit. There were no vitals filed for this visit.  Estimated body mass index is 25.72 kg/m as calculated from the following:   Height as of 03/08/22: 5' 3"$  (1.6 m).   Weight  as of 04/15/22: 145 lb 3.2 oz (65.9 kg).  LABS: CBC:    Component Value Date/Time   WBC 5.4 04/05/2022 1251   HGB 10.6 (L) 04/05/2022 1251   HGB 8.2 (L) 11/02/2011 1951   HCT 31.8 (L) 04/05/2022 1251   HCT 27.7 (L) 10/09/2011 0611   PLT 274 04/05/2022 1251   PLT 484 (H) 10/09/2011 0611   MCV 77.0 (L) 04/05/2022 1251   MCV 65 (L) 10/09/2011 0611   NEUTROABS 3.9 04/05/2022 1251   NEUTROABS 5.0 10/09/2011 0611   LYMPHSABS 0.9 04/05/2022 1251   LYMPHSABS 2.6 10/09/2011 0611   MONOABS 0.4 04/05/2022 1251   MONOABS 0.7 10/09/2011 0611   EOSABS 0.2 04/05/2022 1251   EOSABS 0.5 10/09/2011 0611   BASOSABS 0.0 04/05/2022 1251   BASOSABS 0.1 10/09/2011 0611   Comprehensive Metabolic Panel:    Component Value Date/Time   NA 132 (L) 04/05/2022 1251   NA 136 10/07/2011 1728   K 4.0 04/05/2022 1251   K 3.7 10/07/2011 1728   CL 98 04/05/2022 1251   CL 103 10/07/2011 1728    CO2 23 04/05/2022 1251   CO2 25 10/07/2011 1728   BUN 10 04/05/2022 1251   BUN 6 (L) 10/07/2011 1728   CREATININE 0.80 04/05/2022 1251   CREATININE 0.88 10/07/2011 1728   GLUCOSE 127 (H) 04/05/2022 1251   GLUCOSE 111 (H) 10/07/2011 1728   CALCIUM 8.9 04/05/2022 1251   CALCIUM 8.8 10/07/2011 1728   AST 26 04/05/2022 1251   AST 26 10/07/2011 1728   ALT 17 04/05/2022 1251   ALT 19 10/07/2011 1728   ALKPHOS 48 04/05/2022 1251   ALKPHOS 40 (L) 10/07/2011 1728   BILITOT 0.2 (L) 04/05/2022 1251   BILITOT 0.2 10/07/2011 1728   PROT 7.7 04/05/2022 1251   PROT 7.8 10/07/2011 1728   ALBUMIN 4.4 04/05/2022 1251   ALBUMIN 4.2 10/07/2011 1728    RADIOGRAPHIC STUDIES: No results found.  PERFORMANCE STATUS (ECOG) : 1 - Symptomatic but completely ambulatory  Review of Systems Unless otherwise noted, a complete review of systems is negative.  Physical Exam General: NAD Cardiovascular: regular rate and rhythm Pulmonary: clear ant fields Abdomen: soft, nontender, + bowel sounds GU: no suprapubic tenderness Extremities: no edema, no joint deformities Skin: Erythematous, warm to touch around port area Neurological: Grossly nonfocal    IMPRESSION/PLAN: Infected port -patient has completed chemotherapy.  Discussed with Dr. Tasia Catchings and IR and will proceed with port removal ASAP.  Will send her for labs/blood cultures.  Will start her empirically on antibiotics with Bactrim DS.   Patient expressed understanding and was in agreement with this plan. She also understands that She can call clinic at any time with any questions, concerns, or complaints.   Thank you for allowing me to participate in the care of this very pleasant patient.   Time Total: 25 minutes  Visit consisted of counseling and education dealing with the complex and emotionally intense issues of symptom management in the setting of serious illness.Greater than 50%  of this time was spent counseling and coordinating care related  to the above assessment and plan.  Signed by: Altha Harm, PhD, NP-C

## 2022-04-21 ENCOUNTER — Other Ambulatory Visit: Payer: Self-pay | Admitting: Interventional Radiology

## 2022-04-21 ENCOUNTER — Ambulatory Visit
Admission: RE | Admit: 2022-04-21 | Discharge: 2022-04-21 | Disposition: A | Discharge status: Home / Self Care | Payer: Medicaid Other | Source: Ambulatory Visit | Attending: Radiation Oncology | Admitting: Radiation Oncology

## 2022-04-21 ENCOUNTER — Emergency Department
Admission: EM | Admit: 2022-04-21 | Discharge: 2022-04-21 | Disposition: A | Discharge status: Home / Self Care | Payer: Medicaid Other | Attending: Emergency Medicine | Admitting: Emergency Medicine

## 2022-04-21 ENCOUNTER — Encounter: Payer: Self-pay | Admitting: Hospice and Palliative Medicine

## 2022-04-21 ENCOUNTER — Other Ambulatory Visit: Payer: Self-pay

## 2022-04-21 ENCOUNTER — Other Ambulatory Visit: Payer: Self-pay | Admitting: Student

## 2022-04-21 ENCOUNTER — Ambulatory Visit
Admission: RE | Admit: 2022-04-21 | Discharge: 2022-04-21 | Disposition: A | Discharge status: Home / Self Care | Payer: Medicaid Other | Source: Ambulatory Visit | Attending: Hospice and Palliative Medicine | Admitting: Hospice and Palliative Medicine

## 2022-04-21 DIAGNOSIS — T829XXA Unspecified complication of cardiac and vascular prosthetic device, implant and graft, initial encounter: Secondary | ICD-10-CM

## 2022-04-21 DIAGNOSIS — Y713 Surgical instruments, materials and cardiovascular devices (including sutures) associated with adverse incidents: Secondary | ICD-10-CM | POA: Insufficient documentation

## 2022-04-21 DIAGNOSIS — T8149XA Infection following a procedure, other surgical site, initial encounter: Secondary | ICD-10-CM | POA: Diagnosis not present

## 2022-04-21 DIAGNOSIS — Z9889 Other specified postprocedural states: Secondary | ICD-10-CM

## 2022-04-21 DIAGNOSIS — T82598A Other mechanical complication of other cardiac and vascular devices and implants, initial encounter: Secondary | ICD-10-CM | POA: Diagnosis present

## 2022-04-21 DIAGNOSIS — T827XXA Infection and inflammatory reaction due to other cardiac and vascular devices, implants and grafts, initial encounter: Secondary | ICD-10-CM | POA: Diagnosis present

## 2022-04-21 DIAGNOSIS — Y849 Medical procedure, unspecified as the cause of abnormal reaction of the patient, or of later complication, without mention of misadventure at the time of the procedure: Secondary | ICD-10-CM | POA: Diagnosis not present

## 2022-04-21 DIAGNOSIS — B999 Unspecified infectious disease: Secondary | ICD-10-CM

## 2022-04-21 DIAGNOSIS — L089 Local infection of the skin and subcutaneous tissue, unspecified: Secondary | ICD-10-CM

## 2022-04-21 DIAGNOSIS — Z510 Encounter for antineoplastic radiation therapy: Secondary | ICD-10-CM | POA: Diagnosis not present

## 2022-04-21 HISTORY — PX: IR REMOVAL TUN ACCESS W/ PORT W/O FL MOD SED: IMG2290

## 2022-04-21 LAB — RAD ONC ARIA SESSION SUMMARY
Course Elapsed Days: 43
Plan Fractions Treated to Date: 23
Plan Prescribed Dose Per Fraction: 1.8 Gy
Plan Total Fractions Prescribed: 30
Plan Total Prescribed Dose: 54 Gy
Reference Point Dosage Given to Date: 41.4 Gy
Reference Point Session Dosage Given: 1.8 Gy
Session Number: 23

## 2022-04-21 MED ORDER — MIDAZOLAM HCL 2 MG/2ML IJ SOLN
INTRAMUSCULAR | Status: AC | PRN
  Administered 2022-04-21: 1 mg via INTRAVENOUS

## 2022-04-21 MED ORDER — LIDOCAINE-EPINEPHRINE 1 %-1:100000 IJ SOLN
INTRAMUSCULAR | Status: AC
  Administered 2022-04-21: 6 mL
  Filled 2022-04-21: qty 1

## 2022-04-21 MED ORDER — CEFAZOLIN SODIUM-DEXTROSE 1-4 GM/50ML-% IV SOLN
INTRAVENOUS | Status: AC | PRN
  Administered 2022-04-21: 2 g via INTRAVENOUS

## 2022-04-21 MED ORDER — CEFAZOLIN SODIUM-DEXTROSE 2-4 GM/100ML-% IV SOLN
INTRAVENOUS | Status: AC
  Filled 2022-04-21: qty 100

## 2022-04-21 MED ORDER — MIDAZOLAM HCL 2 MG/2ML IJ SOLN
INTRAMUSCULAR | Status: AC
  Filled 2022-04-21: qty 2

## 2022-04-21 MED ORDER — FENTANYL CITRATE (PF) 100 MCG/2ML IJ SOLN
INTRAMUSCULAR | Status: AC
  Filled 2022-04-21: qty 2

## 2022-04-21 MED ORDER — CEFAZOLIN SODIUM-DEXTROSE 2-4 GM/100ML-% IV SOLN: 2.0000 g | INTRAVENOUS | Status: DC

## 2022-04-21 MED ORDER — MIDAZOLAM HCL 5 MG/5ML IJ SOLN
INTRAMUSCULAR | Status: AC | PRN
  Administered 2022-04-21: 1 mg via INTRAVENOUS

## 2022-04-21 MED ORDER — FENTANYL CITRATE (PF) 100 MCG/2ML IJ SOLN
INTRAMUSCULAR | Status: AC | PRN
  Administered 2022-04-21 (×2): 50 ug via INTRAVENOUS

## 2022-04-21 MED ORDER — SODIUM CHLORIDE 0.9 % IV SOLN: INTRAVENOUS | Status: DC

## 2022-04-21 NOTE — Progress Notes (Signed)
Patient clinically stable post port removal per Dr Dwaine Gale, tolerated well. Vitals stable pre and post procedure. Received Versed 2 mg along with Fentanyl 100 mcg IV for procedure. Discharge instructions given to patient and SO/ with questions answered. Denies  complaints post procedure. Instructions given per IR for patient to return to IR next week, ie Monday/Thursday for wound check. Instructions also given to contact MD if symptomatic from infection ie fever/increasing breathing or HR. Stated understanding.

## 2022-04-21 NOTE — ED Notes (Signed)
Dr. Dwaine Gale, with IR, called stating he saw patient in office today.  Removed chest port d/t puss in port pocket.  Area was packed with hyrogel with telfa pad and tegaderm on top.  States is on-call and glad to answer any questions.

## 2022-04-21 NOTE — ED Triage Notes (Signed)
Patient presents with bleeding from her port. Port was removed today due to infection. Patient saw the vascular surgeon today here at [facility] to have the port removed. While in office patient was treated with antibiotics and is currently taking antibiotics at home.

## 2022-04-21 NOTE — ED Provider Notes (Signed)
   [facility] Provider Note    Event Date/Time   First MD Initiated Contact with Patient 04/21/22 2145     (approximate)   History   Vascular Access Problem   HPI  Cheryl Goodwin is a 62 y.o. female  who presents to the emergency department today because of concern for bleeding from site of recent port removal. The patient had it removed today. When she got home she noticed it was bleeding through the dressing and getting on her shirt. The port was removed because of infection.      Physical Exam   Triage Vital Signs: ED Triage Vitals  Enc Vitals Group     BP 04/21/22 2002 105/66     Pulse Rate 04/21/22 2002 91     Resp 04/21/22 2002 17     Temp 04/21/22 2002 99.2 F (37.3 C)     Temp Source 04/21/22 2002 Oral     SpO2 04/21/22 1959 95 %     Weight 04/21/22 2000 142 lb (64.4 kg)     Height 04/21/22 2000 5' 2"$  (1.575 m)     Head Circumference --      Peak Flow --      Pain Score 04/21/22 2000 5     Pain Loc --      Pain Edu? --      Excl. in [place]? --     Most recent vital signs: Vitals:   04/21/22 2002 04/21/22 2200  BP: 105/66 102/67  Pulse: 91 76  Resp: 17 17  Temp: 99.2 F (37.3 C) 98.7 F (37.1 C)  SpO2: 95% 96%   General: Awake, alert, oriented. CV:  Good peripheral perfusion.  Resp:  Normal effort.  Abd:  No distention.  Other:  Wound to right chest, small amount of purulent discharge. No active bleeding.   ED Results / Procedures / Treatments   Labs (all labs ordered are listed, but only abnormal results are displayed) Labs Reviewed - No data to display   EKG  None   RADIOLOGY None   PROCEDURES:  Critical Care performed: No  Procedures   MEDICATIONS ORDERED IN ED: Medications - No data to display   IMPRESSION / MDM / [place] / ED COURSE  I reviewed the triage vital signs and the nursing notes.                              Differential diagnosis includes, but is not limited to, infection,  bleeding.  Patient's presentation is most consistent with acute presentation with potential threat to life or bodily function.  Patient presented to the emergency department today because of concerns for bleeding from right chest wound where port was recently removed.  On my exam there is small amount of purulent discharge.  Patient is already receiving antibiotics.  No active bleeding.  I did redress the wound.  Did discuss with patient that though likely would continue to be some discharge from the wound.  Did give infection return precautions.   FINAL CLINICAL IMPRESSION(S) / ED DIAGNOSES   Final diagnoses:  Wound infection      Note:  This document was prepared using Dragon voice recognition software and may include unintentional dictation errors.    Nance Pear, MD 04/21/22 2330

## 2022-04-21 NOTE — Discharge Instructions (Signed)
Please seek medical attention for any high fevers, chest pain, shortness of breath, change in behavior, persistent vomiting, bloody stool or any other new or concerning symptoms.  

## 2022-04-21 NOTE — H&P (Signed)
Chief Complaint: Patient was seen in consultation today for image-guided port removal   Referring Physician(s): Irean Hong  Supervising Physician: Mir, Sharen Heck  Patient Status: [facility] - Out-pt  History of Present Illness: Cheryl Goodwin is a 62 y.o. female with PMH significant for anal squamous cell carcinoma and port placement 02/11/22 being seen today for image-guided port removal secondary to infection. The patient was seen yesterday by Hospice and Palliative Care team due to concern of a warm, red, and painful skin site overlying the area where the patient had their port placed. There is concern for infection at the port site, and the patient was subsequently referred to IR for port removal. The patient has been started on empiric antibiotics by her treatment team.   Past Medical History:  Diagnosis Date   Depression    GERD (gastroesophageal reflux disease)    Hyperlipidemia     Past Surgical History:  Procedure Laterality Date   AUGMENTATION MAMMAPLASTY Bilateral 2005   with lift   BACK SURGERY  09/24/2014   L1 fracture, [facility]   BREAST ENHANCEMENT SURGERY  2007   COLONOSCOPY  2013   ENDOMETRIAL ABLATION  2013   ESOPHAGOGASTRODUODENOSCOPY (EGD) WITH PROPOFOL N/A 07/08/2016   Procedure: ESOPHAGOGASTRODUODENOSCOPY (EGD) WITH PROPOFOL;  Surgeon: Lucilla Lame, MD;  Location: [place];  Service: Endoscopy;  Laterality: N/A;   IR IMAGING GUIDED PORT INSERTION  02/11/2022   STOMACH SURGERY  2006   "Tummy Tuck"   TUBAL LIGATION      Allergies: Patient has no known allergies.  Medications: Prior to Admission medications   Medication Sig Start Date End Date Taking? Authorizing Provider  buPROPion (WELLBUTRIN XL) 300 MG 24 hr tablet Take 300 mg by mouth daily.    [provider]  esomeprazole (NEXIUM) 40 MG capsule Take by mouth. 03/04/22 03/04/23  [provider]  lidocaine-prilocaine (EMLA) cream Apply to affected area once Patient not  taking: Reported on 04/20/2022 02/06/22   Earlie Server, MD  loperamide (IMODIUM) 2 MG capsule Take 1 capsule (2 mg total) by mouth See admin instructions. Initial: 4 mg, followed by 2 mg after each loose stool; maximum: 16 mg/day 04/05/22   Earlie Server, MD  lovastatin (MEVACOR) 20 MG tablet SMARTSIG:1 Tablet(s) By Mouth Every Evening 02/19/22   [provider]  magic mouthwash (multi-ingredient) oral suspension Swish and swallow 5-10 mLs by mouth 4 (four) times daily as needed for mouth pain. Patient not taking: Reported on 04/20/2022 03/22/22     magic mouthwash w/lidocaine SOLN Take 5 mLs by mouth 4 (four) times daily as needed for mouth pain. Sig: Swish/Swallow 5-10 ml four times a day as needed. Dispense 480 ml. 1RF Patient not taking: Reported on 04/20/2022 03/22/22   Earlie Server, MD  omeprazole (PRILOSEC) 40 MG capsule Take 1 capsule (40 mg total) by mouth daily. 07/19/16   Lucilla Lame, MD  ondansetron (ZOFRAN) 8 MG tablet Take 1 tablet (8 mg total) by mouth every 8 (eight) hours as needed for nausea or vomiting. 02/06/22   Earlie Server, MD  prochlorperazine (COMPAZINE) 10 MG tablet Take 1 tablet (10 mg total) by mouth every 6 (six) hours as needed for nausea or vomiting. 02/06/22   Earlie Server, MD  sertraline (ZOLOFT) 100 MG tablet Take 150 mg by mouth daily.    [provider]  silver sulfADIAZINE (SILVADENE) 1 % cream Apply 1 Application topically daily. 04/12/22   Noreene Filbert, MD  simvastatin (ZOCOR) 20 MG tablet Take 20 mg by mouth  every evening.    [provider]  sulfamethoxazole-trimethoprim (BACTRIM DS) 800-160 MG tablet Take 1 tablet by mouth 2 (two) times daily. 04/20/22   Borders, Kirt Boys, NP  traMADol (ULTRAM) 50 MG tablet Take 1 tablet (50 mg total) by mouth every 6 (six) hours as needed. 03/11/22   Earlie Server, MD  triamcinolone cream (KENALOG) 0.1 % Apply 1 Application topically 2 (two) times daily. To area affected by radiation 04/15/22   Verlon Au, NP     Family History   Problem Relation Age of Onset   Stroke Mother    Breast cancer Mother    Emphysema Father        was a smoker   COPD Sister        was a smoker   Cancer Brother     Social History   Socioeconomic History   Marital status: Widowed    Spouse name: Not on file   Number of children: 2   Years of education: Not on file   Highest education level: Not on file  Occupational History   Occupation: Unemployed  Tobacco Use   Smoking status: Never   Smokeless tobacco: Never  Substance and Sexual Activity   Alcohol use: Yes    Comment: 10 beers/month   Drug use: No   Sexual activity: Not on file  Other Topics Concern   Not on file  Social History Narrative   Not on file   Social Determinants of Health   Financial Resource Strain: Not on file  Food Insecurity: Not on file  Transportation Needs: Not on file  Physical Activity: Not on file  Stress: Not on file  Social Connections: Not on file    Code Status: Full Code  Review of Systems: A 12 point ROS discussed and pertinent positives are indicated in the HPI above.  All other systems are negative.  Review of Systems  Constitutional:  Positive for chills. Negative for fever.  Respiratory:  Negative for choking, chest tightness and shortness of breath.   Cardiovascular:  Negative for chest pain and leg swelling.  Gastrointestinal:  Negative for abdominal pain, diarrhea, nausea and vomiting.  Neurological:  Positive for headaches. Negative for dizziness.  Psychiatric/Behavioral:  Negative for confusion.     Vital Signs: BP 110/72   Pulse 86   Temp 98.4 F (36.9 C) (Oral)   Resp 20   Ht 5' 5"$  (1.651 m)   Wt 145 lb 8.1 oz (66 kg)   BMI 24.21 kg/m     Physical Exam Vitals reviewed.  Constitutional:      General: She is not in acute distress.    Appearance: She is not ill-appearing.  Cardiovascular:     Rate and Rhythm: Normal rate and regular rhythm.     Pulses: Normal pulses.     Heart sounds: Normal heart  sounds.  Pulmonary:     Effort: Pulmonary effort is normal.     Breath sounds: Normal breath sounds.  Abdominal:     General: Bowel sounds are normal.     Palpations: Abdomen is soft.     Tenderness: There is no abdominal tenderness.  Musculoskeletal:     Right lower leg: No edema.     Left lower leg: No edema.  Skin:    General: Skin is warm and dry.     Comments: Fluctuant mass concerning for abscess present in right chest. Skin is red, warm, and tender to touch  Neurological:  Mental Status: She is alert and oriented to person, place, and time.  Psychiatric:        Mood and Affect: Mood normal.        Behavior: Behavior normal.        Thought Content: Thought content normal.        Judgment: Judgment normal.     Imaging: No results found.  Labs:  CBC: Recent Labs    03/08/22 1435 03/22/22 1025 04/05/22 1251 04/20/22 1135  WBC 6.4 2.8* 5.4 3.5*  HGB 10.8* 9.1* 10.6* 9.4*  HCT 32.9* 26.6* 31.8* 28.6*  PLT 376 119* 274 144*    COAGS: No results for input(s): "INR", "APTT" in the last 8760 hours.  BMP: Recent Labs    03/08/22 1435 03/22/22 1025 04/05/22 1251 04/20/22 1135  NA 132* 135 132* 134*  K 3.9 4.1 4.0 3.5  CL 100 100 98 97*  CO2 23 26 23 25  $ GLUCOSE 101* 101* 127* 92  BUN 10 7 10 10  $ CALCIUM 8.9 8.6* 8.9 8.8*  CREATININE 0.80 0.81 0.80 0.75  GFRNONAA >60 >60 >60 >60    LIVER FUNCTION TESTS: Recent Labs    03/08/22 1435 03/22/22 1025 04/05/22 1251 04/20/22 1135  BILITOT 0.4 0.2* 0.2* 0.6  AST 23 21 26 18  $ ALT 15 16 17 15  $ ALKPHOS 46 40 48 52  PROT 7.8 7.3 7.7 8.1  ALBUMIN 4.5 3.8 4.4 4.1    TUMOR MARKERS: No results for input(s): "AFPTM", "CEA", "CA199", "CHROMGRNA" in the last 8760 hours.  Assessment and Plan:  Cheryl Goodwin is a 62 yo female presenting today for image-guided port removal secondary to infection at the port site. On exam today, the patient appears to have an abscess at the port site as demonstrated by a red,  warm, tender, and fluctuant mass at the site. The patient's case has been reviewed with Dr Dwaine Gale who will be removing the port today.  Risks and benefits of image guided port-a-catheter removal was discussed with the patient including, but not limited to bleeding, infection, and damage to blood vessel.  All of the patient's questions were answered, patient is agreeable to proceed. Consent signed and in chart.   Thank you for this interesting consult.  I greatly enjoyed Cheryl Goodwin and look forward to participating in their care.  A copy of this report was sent to the requesting provider on this date.  Electronically Signed: Lura Em, PA-C 04/21/2022, 12:30 PM   I spent a total of  25 Minutes in face to face in clinical consultation, greater than 50% of which was counseling/coordinating care for port removal secondary to suspected infection

## 2022-04-21 NOTE — Procedures (Signed)
Interventional Radiology Procedure Note  Procedure: Chest port removal  Indication: Infected port pocket  Findings: Please refer to procedural dictation for full description.  Complications: None  EBL: < 10 mL  Miachel Roux, MD [phone]

## 2022-04-22 ENCOUNTER — Telehealth: Payer: Self-pay | Admitting: *Deleted

## 2022-04-22 ENCOUNTER — Other Ambulatory Visit: Payer: Self-pay

## 2022-04-22 ENCOUNTER — Ambulatory Visit
Admission: RE | Admit: 2022-04-22 | Discharge: 2022-04-22 | Disposition: A | Discharge status: Home / Self Care | Payer: Medicaid Other | Source: Ambulatory Visit | Attending: Radiation Oncology | Admitting: Radiation Oncology

## 2022-04-22 DIAGNOSIS — Z510 Encounter for antineoplastic radiation therapy: Secondary | ICD-10-CM | POA: Diagnosis not present

## 2022-04-22 LAB — RAD ONC ARIA SESSION SUMMARY
Course Elapsed Days: 44
Plan Fractions Treated to Date: 24
Plan Prescribed Dose Per Fraction: 1.8 Gy
Plan Total Fractions Prescribed: 30
Plan Total Prescribed Dose: 54 Gy
Reference Point Dosage Given to Date: 43.2 Gy
Reference Point Session Dosage Given: 1.8 Gy
Session Number: 24

## 2022-04-22 NOTE — Telephone Encounter (Signed)
Patient sent a mychart msg last night. She experienced some bleeding and discharge from port removal site and went to the ER for evaluation. She reported a low grade fever, headache and body aches at that time. The bleeding caught her off guard. Wound was left open by ER and packed. She was concerned about her wound infection and that she did not have any dressing supplies s/p port removal. She had a f/u in IR scheduled for Monday for a wound check. After reviewing her ER visit within chart,   I called patient to f/u post ER visit. She reports improvement in her wound and she now has supplies to manage her wound. I offered pt an apt in [facility] to f/u. She declines as she is improving. Patient understands that she can let us know if she develops any fevers, chills or concerns with her wound. She gave verbal understanding and thanked me for following up with her post ER visit.

## 2022-04-23 ENCOUNTER — Other Ambulatory Visit: Payer: Self-pay

## 2022-04-23 ENCOUNTER — Ambulatory Visit
Admission: RE | Admit: 2022-04-23 | Discharge: 2022-04-23 | Disposition: A | Discharge status: Home / Self Care | Payer: Medicaid Other | Source: Ambulatory Visit | Attending: Radiation Oncology | Admitting: Radiation Oncology

## 2022-04-23 DIAGNOSIS — Z510 Encounter for antineoplastic radiation therapy: Secondary | ICD-10-CM | POA: Diagnosis not present

## 2022-04-23 LAB — RAD ONC ARIA SESSION SUMMARY
Course Elapsed Days: 45
Plan Fractions Treated to Date: 25
Plan Prescribed Dose Per Fraction: 1.8 Gy
Plan Total Fractions Prescribed: 30
Plan Total Prescribed Dose: 54 Gy
Reference Point Dosage Given to Date: 45 Gy
Reference Point Session Dosage Given: 1.8 Gy
Session Number: 25

## 2022-04-25 LAB — CULTURE, BLOOD (ROUTINE X 2)
Culture: NO GROWTH
Culture: NO GROWTH

## 2022-04-26 ENCOUNTER — Ambulatory Visit
Admission: RE | Admit: 2022-04-26 | Discharge: 2022-04-26 | Disposition: A | Discharge status: Home / Self Care | Payer: Medicaid Other | Source: Ambulatory Visit | Attending: Interventional Radiology | Admitting: Interventional Radiology

## 2022-04-26 ENCOUNTER — Ambulatory Visit: Payer: Medicaid Other

## 2022-04-26 ENCOUNTER — Other Ambulatory Visit: Payer: Self-pay

## 2022-04-26 ENCOUNTER — Ambulatory Visit
Admission: RE | Admit: 2022-04-26 | Discharge: 2022-04-26 | Disposition: A | Discharge status: Home / Self Care | Payer: Medicaid Other | Source: Ambulatory Visit | Attending: Radiation Oncology | Admitting: Radiation Oncology

## 2022-04-26 DIAGNOSIS — Z510 Encounter for antineoplastic radiation therapy: Secondary | ICD-10-CM | POA: Diagnosis not present

## 2022-04-26 DIAGNOSIS — Z9889 Other specified postprocedural states: Secondary | ICD-10-CM

## 2022-04-26 HISTORY — PX: IR RADIOLOGIST EVAL & MGMT: IMG5224

## 2022-04-26 LAB — RAD ONC ARIA SESSION SUMMARY
Course Elapsed Days: 48
Plan Fractions Treated to Date: 26
Plan Prescribed Dose Per Fraction: 1.8 Gy
Plan Total Fractions Prescribed: 30
Plan Total Prescribed Dose: 54 Gy
Reference Point Dosage Given to Date: 46.8 Gy
Reference Point Session Dosage Given: 1.8 Gy
Session Number: 26

## 2022-04-26 LAB — AEROBIC/ANAEROBIC CULTURE W GRAM STAIN (SURGICAL/DEEP WOUND)

## 2022-04-26 NOTE — Progress Notes (Signed)
Chief Complaint: Patient was seen in consultation today for wound check s/p port removal 04/21/22  Referring Physician(s): Mir,Farhaan R  Supervising Physician: Juliet Rude  Patient Status: [facility] - Out-pt  History of Present Illness: Cheryl Goodwin is a 62 y.o. female patient with poor initially placed 02/11/2022 by Dr. Denna Haggard.  Port was initially placed to facilitate chemotherapy for anal squamous cell carcinoma.  Patient was seen on 04/21/2022 following reports of redness and swelling at the port site.  Was determined that the patient had an abscess at the port site, necessitating removal of port on 04/21/2022.  Before removal, patient reported pain, tenderness, fever, chills, redness at port site.  During removal of port was noted the patient had a significant amount of purulent material.  Patient presented to ED on evening of 04/21/2022 with concerns of bleeding.  At that time the wound was redressed.  Patient was subsequently discharged from the emergency department.  Patient was scheduled to be seen as an outpatient today for wound site evaluation and dressing  Past Medical History:  Diagnosis Date   Depression    GERD (gastroesophageal reflux disease)    Hyperlipidemia     Past Surgical History:  Procedure Laterality Date   AUGMENTATION MAMMAPLASTY Bilateral 2005   with lift   BACK SURGERY  09/24/2014   L1 fracture, [facility]   BREAST ENHANCEMENT SURGERY  2007   COLONOSCOPY  2013   ENDOMETRIAL ABLATION  2013   ESOPHAGOGASTRODUODENOSCOPY (EGD) WITH PROPOFOL N/A 07/08/2016   Procedure: ESOPHAGOGASTRODUODENOSCOPY (EGD) WITH PROPOFOL;  Surgeon: Lucilla Lame, MD;  Location: [place];  Service: Endoscopy;  Laterality: N/A;   IR IMAGING GUIDED PORT INSERTION  02/11/2022   IR REMOVAL TUN ACCESS W/ PORT W/O FL MOD SED  04/21/2022   STOMACH SURGERY  2006   "Tummy Tuck"   TUBAL LIGATION      Allergies: Patient has no known allergies.  Medications: Prior to Admission  medications   Medication Sig Start Date End Date Taking? Authorizing Provider  buPROPion (WELLBUTRIN XL) 300 MG 24 hr tablet Take 300 mg by mouth daily.    [provider]  esomeprazole (NEXIUM) 40 MG capsule Take by mouth. 03/04/22 03/04/23  [provider]  lidocaine-prilocaine (EMLA) cream Apply to affected area once Patient not taking: Reported on 04/20/2022 02/06/22   Earlie Server, MD  loperamide (IMODIUM) 2 MG capsule Take 1 capsule (2 mg total) by mouth See admin instructions. Initial: 4 mg, followed by 2 mg after each loose stool; maximum: 16 mg/day 04/05/22   Earlie Server, MD  lovastatin (MEVACOR) 20 MG tablet SMARTSIG:1 Tablet(s) By Mouth Every Evening 02/19/22   [provider]  magic mouthwash (multi-ingredient) oral suspension Swish and swallow 5-10 mLs by mouth 4 (four) times daily as needed for mouth pain. Patient not taking: Reported on 04/20/2022 03/22/22     magic mouthwash w/lidocaine SOLN Take 5 mLs by mouth 4 (four) times daily as needed for mouth pain. Sig: Swish/Swallow 5-10 ml four times a day as needed. Dispense 480 ml. 1RF Patient not taking: Reported on 04/20/2022 03/22/22   Earlie Server, MD  omeprazole (PRILOSEC) 40 MG capsule Take 1 capsule (40 mg total) by mouth daily. 07/19/16   Lucilla Lame, MD  ondansetron (ZOFRAN) 8 MG tablet Take 1 tablet (8 mg total) by mouth every 8 (eight) hours as needed for nausea or vomiting. 02/06/22   Earlie Server, MD  prochlorperazine (COMPAZINE) 10 MG tablet Take 1 tablet (10 mg total) by mouth  every 6 (six) hours as needed for nausea or vomiting. 02/06/22   Earlie Server, MD  sertraline (ZOLOFT) 100 MG tablet Take 150 mg by mouth daily.    [provider]  silver sulfADIAZINE (SILVADENE) 1 % cream Apply 1 Application topically daily. 04/12/22   Noreene Filbert, MD  simvastatin (ZOCOR) 20 MG tablet Take 20 mg by mouth every evening.    [provider]  sulfamethoxazole-trimethoprim (BACTRIM DS) 800-160 MG tablet Take 1 tablet by  mouth 2 (two) times daily. 04/20/22   Borders, Kirt Boys, NP  traMADol (ULTRAM) 50 MG tablet Take 1 tablet (50 mg total) by mouth every 6 (six) hours as needed. 03/11/22   Earlie Server, MD  triamcinolone cream (KENALOG) 0.1 % Apply 1 Application topically 2 (two) times daily. To area affected by radiation 04/15/22   Verlon Au, NP     Family History  Problem Relation Age of Onset   Stroke Mother    Breast cancer Mother    Emphysema Father        was a smoker   COPD Sister        was a smoker   Cancer Brother     Social History   Socioeconomic History   Marital status: Widowed    Spouse name: Not on file   Number of children: 2   Years of education: Not on file   Highest education level: Not on file  Occupational History   Occupation: Unemployed  Tobacco Use   Smoking status: Never   Smokeless tobacco: Never  Substance and Sexual Activity   Alcohol use: Yes    Comment: 10 beers/month   Drug use: No   Sexual activity: Not on file  Other Topics Concern   Not on file  Social History Narrative   Not on file   Social Determinants of Health   Financial Resource Strain: Not on file  Food Insecurity: Not on file  Transportation Needs: Not on file  Physical Activity: Not on file  Stress: Not on file  Social Connections: Not on file     Review of Systems: A 12 point ROS discussed and pertinent positives are indicated in the HPI above.  All other systems are negative.  Review of Systems  Constitutional:  Negative for chills and fever.  Respiratory:  Negative for shortness of breath.   Cardiovascular:  Negative for chest pain.  Gastrointestinal:  Negative for diarrhea, nausea and vomiting.  Skin:  Positive for wound. Negative for rash.       Negative for drainage from right upper chest wound site  Neurological:  Negative for dizziness and headaches.  Psychiatric/Behavioral:  Negative for confusion.     Vital Signs: There were no vitals taken for this  visit.    Physical Exam Constitutional:      General: She is not in acute distress.    Appearance: She is not ill-appearing.  Eyes:     Pupils: Pupils are equal, round, and reactive to light.  Cardiovascular:     Pulses: Normal pulses.  Pulmonary:     Effort: Pulmonary effort is normal.  Musculoskeletal:        General: Signs of injury present. No swelling or deformity. Normal range of motion.     Comments: Patient with wound to right upper chest. No purulence, drainage, or bleeding noted at site.  Skin:    General: Skin is warm and dry.  Neurological:     Mental Status: She is alert and oriented  to person, place, and time.  Psychiatric:        Mood and Affect: Mood normal.        Behavior: Behavior normal.        Thought Content: Thought content normal.        Judgment: Judgment normal.     Imaging: IR REMOVAL TUN ACCESS W/ PORT W/O FL MOD SED  Result Date: 04/21/2022 INDICATION: 62 year old woman with swelling and redness of right chest port pocket presents to IR for removal given concerns for infection. Chest port was placed in IR on 02/11/2022. EXAM: REMOVAL RIGHT IJ VEIN PORT-A-CATH MEDICATIONS: Ancef 2 g IV; The antibiotic was administered within an appropriate time interval prior to skin puncture. ANESTHESIA/SEDATION: Moderate (conscious) sedation was employed during this procedure. A total of Versed 2 mg and Fentanyl 100 mcg was administered intravenously by the radiology nurse. Total intra-service moderate Sedation Time: 11 minutes. The patient's level of consciousness and vital signs were monitored continuously by radiology nursing throughout the procedure under my direct supervision. FLUOROSCOPY: Radiation Exposure Index (as provided by the fluoroscopic device): 1 mGy Kerma COMPLICATIONS: None immediate. PROCEDURE: Informed written consent was obtained from the patient after a thorough discussion of the procedural risks, benefits and alternatives. All questions were  addressed. Maximal Sterile Barrier Technique was utilized including caps, mask, sterile gowns, sterile gloves, sterile drape, hand hygiene and skin antiseptic. A timeout was performed prior to the initiation of the procedure. Right anterior upper chest prepped and draped in the usual sterile fashion. Following local lidocaine administration, an incision was made at the superior margin of the port pocket. Purulent material exited from the port pocket through the incision. The pocket was dissected, and the port and catheter were removed in their entirety. The port and catheter were sent for Gram stain and culture. All purulent material from the pocket was removed and it was vigorously flushed with copious amounts of normal saline. The port pocket was packed with Hydrogel and covered with sterile dressing. Post procedure fluoroscopy image showed no retained fragments. IMPRESSION: 1. Successful removal of right IJ chest port. 2. Purulent material within the port pocket consistent with active infection. The pocket was flushed with copious amounts of normal saline and packed with Hydrogel. PLAN: Return on Monday April 26, 2022 for repacking with Hydrogel. Patient should return every 3-4 days thereafter for repacking with Hydrogel until the incision as healed. Electronically Signed   By: Miachel Roux M.D.   On: 04/21/2022 17:07    Labs:  CBC: Recent Labs    03/08/22 1435 03/22/22 1025 04/05/22 1251 04/20/22 1135  WBC 6.4 2.8* 5.4 3.5*  HGB 10.8* 9.1* 10.6* 9.4*  HCT 32.9* 26.6* 31.8* 28.6*  PLT 376 119* 274 144*    COAGS: No results for input(s): "INR", "APTT" in the last 8760 hours.  BMP: Recent Labs    03/08/22 1435 03/22/22 1025 04/05/22 1251 04/20/22 1135  NA 132* 135 132* 134*  K 3.9 4.1 4.0 3.5  CL 100 100 98 97*  CO2 '23 26 23 25  '$ GLUCOSE 101* 101* 127* 92  BUN '10 7 10 10  '$ CALCIUM 8.9 8.6* 8.9 8.8*  CREATININE 0.80 0.81 0.80 0.75  GFRNONAA >60 >60 >60 >60    LIVER FUNCTION  TESTS: Recent Labs    03/08/22 1435 03/22/22 1025 04/05/22 1251 04/20/22 1135  BILITOT 0.4 0.2* 0.2* 0.6  AST '23 21 26 18  '$ ALT '15 16 17 15  '$ ALKPHOS 46 40 48 52  PROT 7.8  7.3 7.7 8.1  ALBUMIN 4.5 3.8 4.4 4.1    TUMOR MARKERS: No results for input(s): "AFPTM", "CEA", "CA199", "CHROMGRNA" in the last 8760 hours.  Assessment and Plan:  Wound care visit Patient with open wound tract following port removal on 04/21/2022.  Patient presents today with reports of feeling markedly improved.  No residual erythema, warmth, tenderness, swelling were noted at the site.  Site was dressed well with no current concern for infection.  Patient reports taking antibiotics as instructed.  On today's visit, wound was examined and Intrasite Gel was applied to patient's wound.  Sterile gauze and Tegaderm were subsequently placed over site to create a dressing.  Patient to present on 04/29/2022 for further follow-up.  Patient was instructed to call [facility] Radiology or present to ED if fever, chills, or other concerns with her wound occur.  Patient verbalized understanding and willingness to comply   Electronically Signed: Lura Em, PA-C 04/26/2022, 3:33 PM   I spent a total of  25 Minutes in face to face in clinical consultation, greater than 50% of which was counseling/coordinating care for wound care visit

## 2022-04-27 ENCOUNTER — Other Ambulatory Visit: Payer: Self-pay

## 2022-04-27 ENCOUNTER — Ambulatory Visit
Admission: RE | Admit: 2022-04-27 | Discharge: 2022-04-27 | Disposition: A | Discharge status: Home / Self Care | Payer: Medicaid Other | Source: Ambulatory Visit | Attending: Radiation Oncology | Admitting: Radiation Oncology

## 2022-04-27 DIAGNOSIS — Z510 Encounter for antineoplastic radiation therapy: Secondary | ICD-10-CM | POA: Diagnosis not present

## 2022-04-27 LAB — RAD ONC ARIA SESSION SUMMARY
Course Elapsed Days: 49
Plan Fractions Treated to Date: 27
Plan Prescribed Dose Per Fraction: 1.8 Gy
Plan Total Fractions Prescribed: 30
Plan Total Prescribed Dose: 54 Gy
Reference Point Dosage Given to Date: 48.6 Gy
Reference Point Session Dosage Given: 1.8 Gy
Session Number: 27

## 2022-04-28 ENCOUNTER — Ambulatory Visit
Admission: RE | Admit: 2022-04-28 | Discharge: 2022-04-28 | Disposition: A | Discharge status: Home / Self Care | Payer: Medicaid Other | Source: Ambulatory Visit | Attending: Radiation Oncology | Admitting: Radiation Oncology

## 2022-04-28 ENCOUNTER — Other Ambulatory Visit: Payer: Self-pay

## 2022-04-28 ENCOUNTER — Ambulatory Visit: Payer: Medicaid Other

## 2022-04-28 DIAGNOSIS — Z510 Encounter for antineoplastic radiation therapy: Secondary | ICD-10-CM | POA: Diagnosis not present

## 2022-04-28 LAB — RAD ONC ARIA SESSION SUMMARY
Course Elapsed Days: 50
Plan Fractions Treated to Date: 28
Plan Prescribed Dose Per Fraction: 1.8 Gy
Plan Total Fractions Prescribed: 30
Plan Total Prescribed Dose: 54 Gy
Reference Point Dosage Given to Date: 50.4 Gy
Reference Point Session Dosage Given: 1.8 Gy
Session Number: 28

## 2022-04-28 MED ORDER — LOPERAMIDE HCL 2 MG PO CAPS: 2.0000 mg | ORAL_CAPSULE | ORAL | 0 refills | Status: DC

## 2022-04-29 ENCOUNTER — Other Ambulatory Visit: Payer: Self-pay | Admitting: Interventional Radiology

## 2022-04-29 ENCOUNTER — Ambulatory Visit
Admission: RE | Admit: 2022-04-29 | Discharge: 2022-04-29 | Disposition: A | Discharge status: Home / Self Care | Payer: Medicaid Other | Source: Ambulatory Visit | Attending: Interventional Radiology | Admitting: Interventional Radiology

## 2022-04-29 ENCOUNTER — Ambulatory Visit
Admission: RE | Admit: 2022-04-29 | Discharge: 2022-04-29 | Disposition: A | Discharge status: Home / Self Care | Payer: Medicaid Other | Source: Ambulatory Visit | Attending: Radiation Oncology | Admitting: Radiation Oncology

## 2022-04-29 ENCOUNTER — Other Ambulatory Visit: Payer: Self-pay

## 2022-04-29 DIAGNOSIS — Z9889 Other specified postprocedural states: Secondary | ICD-10-CM

## 2022-04-29 DIAGNOSIS — Z510 Encounter for antineoplastic radiation therapy: Secondary | ICD-10-CM | POA: Diagnosis not present

## 2022-04-29 HISTORY — PX: IR RADIOLOGIST EVAL & MGMT: IMG5224

## 2022-04-29 LAB — RAD ONC ARIA SESSION SUMMARY
Course Elapsed Days: 51
Plan Fractions Treated to Date: 29
Plan Prescribed Dose Per Fraction: 1.8 Gy
Plan Total Fractions Prescribed: 30
Plan Total Prescribed Dose: 54 Gy
Reference Point Dosage Given to Date: 52.2 Gy
Reference Point Session Dosage Given: 1.8 Gy
Session Number: 29

## 2022-04-30 ENCOUNTER — Other Ambulatory Visit: Payer: Self-pay

## 2022-04-30 ENCOUNTER — Ambulatory Visit
Admission: RE | Admit: 2022-04-30 | Discharge: 2022-04-30 | Disposition: A | Discharge status: Home / Self Care | Payer: Medicaid Other | Source: Ambulatory Visit | Attending: Radiation Oncology | Admitting: Radiation Oncology

## 2022-04-30 DIAGNOSIS — K219 Gastro-esophageal reflux disease without esophagitis: Secondary | ICD-10-CM | POA: Diagnosis not present

## 2022-04-30 DIAGNOSIS — Z510 Encounter for antineoplastic radiation therapy: Secondary | ICD-10-CM | POA: Insufficient documentation

## 2022-04-30 DIAGNOSIS — E785 Hyperlipidemia, unspecified: Secondary | ICD-10-CM | POA: Diagnosis not present

## 2022-04-30 DIAGNOSIS — C210 Malignant neoplasm of anus, unspecified: Secondary | ICD-10-CM | POA: Insufficient documentation

## 2022-04-30 DIAGNOSIS — Z79899 Other long term (current) drug therapy: Secondary | ICD-10-CM | POA: Insufficient documentation

## 2022-04-30 DIAGNOSIS — F32A Depression, unspecified: Secondary | ICD-10-CM | POA: Insufficient documentation

## 2022-04-30 LAB — RAD ONC ARIA SESSION SUMMARY
Course Elapsed Days: 52
Plan Fractions Treated to Date: 30
Plan Prescribed Dose Per Fraction: 1.8 Gy
Plan Total Fractions Prescribed: 30
Plan Total Prescribed Dose: 54 Gy
Reference Point Dosage Given to Date: 54 Gy
Reference Point Session Dosage Given: 1.8 Gy
Session Number: 30

## 2022-05-06 ENCOUNTER — Ambulatory Visit: Admission: RE | Admit: 2022-05-06 | Payer: Medicaid Other | Source: Ambulatory Visit | Admitting: Radiology

## 2022-05-07 ENCOUNTER — Ambulatory Visit
Admission: RE | Admit: 2022-05-07 | Discharge: 2022-05-07 | Disposition: A | Discharge status: Home / Self Care | Payer: Medicaid Other | Source: Ambulatory Visit | Attending: Interventional Radiology | Admitting: Interventional Radiology

## 2022-05-07 DIAGNOSIS — Z9889 Other specified postprocedural states: Secondary | ICD-10-CM

## 2022-05-07 DIAGNOSIS — Z4800 Encounter for change or removal of nonsurgical wound dressing: Secondary | ICD-10-CM | POA: Insufficient documentation

## 2022-05-07 HISTORY — PX: IR RADIOLOGIST EVAL & MGMT: IMG5224

## 2022-06-08 ENCOUNTER — Encounter: Payer: Self-pay | Admitting: Oncology

## 2022-06-09 ENCOUNTER — Ambulatory Visit
Admission: RE | Admit: 2022-06-09 | Discharge: 2022-06-09 | Disposition: A | Discharge status: Home / Self Care | Payer: Medicaid Other | Source: Ambulatory Visit | Attending: Radiation Oncology | Admitting: Radiation Oncology

## 2022-06-09 VITALS — BP 99/88 | HR 92 | Temp 97.1°F | Resp 14 | Ht 62.0 in | Wt 144.8 lb

## 2022-06-09 DIAGNOSIS — C210 Malignant neoplasm of anus, unspecified: Secondary | ICD-10-CM | POA: Insufficient documentation

## 2022-06-09 NOTE — Progress Notes (Signed)
Radiation Oncology Follow up Note  Name: Cheryl Goodwin   Date:   06/09/2022 MRN:  [id number] DOB: 12/25/1960    This 62 y.o. 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  REFERRING PROVIDER: Marina Goodell, MD  HPI: Patient is a 62 year old female now out 1 month having completed concurrent chemoradiation therapy for stage IIIc squamous cell carcinoma the anus.  Seen today in routine follow-up she is doing fairly well she states she does have occasional rectal incontinence does wear depends undergarment.  She is having no significant diarrhea or dysuria at this time..  COMPLICATIONS OF TREATMENT: none  FOLLOW UP COMPLIANCE: keeps appointments   PHYSICAL EXAM:  BP 99/88   Pulse 92   Temp (!) 97.1 F (36.2 C)   Resp 14   Ht 5\' 2"  (1.575 m)   Wt 144 lb 12.8 oz (65.7 kg)   BMI 26.48 kg/m  On rectal exam rectal sphincter tone is good no evidence of mass or nodularity in the anus is identified.  No inguinal adenopathy is noted.  Well-developed well-nourished patient in NAD. HEENT reveals PERLA, EOMI, discs not visualized.  Oral cavity is clear. No oral mucosal lesions are identified. Neck is clear without evidence of cervical or supraclavicular adenopathy. Lungs are clear to A&P. Cardiac examination is essentially unremarkable with regular rate and rhythm without murmur rub or thrill. Abdomen is benign with no organomegaly or masses noted. Motor sensory and DTR levels are equal and symmetric in the upper and lower extremities. Cranial nerves II through XII are grossly intact. Proprioception is intact. No peripheral adenopathy or edema is identified. No motor or sensory levels are noted. Crude visual fields are within normal range.  RADIOLOGY RESULTS: No current films for review  PLAN: Present time from a side effect profile she is doing well I explained to her she  had a large anal tumor which probably affected her anal sphincter.  This is the reason why she is having some incontinence.  This may improve over time.  She continues close follow-up care with medical oncology and will have follow-up imaging ordered by medical oncology.  I have asked to see her back in 4 months for follow-up.  Patient is to call with any concerns.  I would like to take this opportunity to thank you for allowing me to participate in the care of your patient.Carmina Miller, MD

## 2022-06-11 ENCOUNTER — Other Ambulatory Visit: Payer: Self-pay

## 2022-07-13 ENCOUNTER — Encounter: Payer: Self-pay | Admitting: Oncology

## 2022-07-13 ENCOUNTER — Inpatient Hospital Stay: Payer: Medicaid Other | Attending: Oncology

## 2022-07-13 ENCOUNTER — Inpatient Hospital Stay (HOSPITAL_BASED_OUTPATIENT_CLINIC_OR_DEPARTMENT_OTHER): Payer: Medicaid Other | Admitting: Oncology

## 2022-07-13 VITALS — BP 104/82 | HR 78 | Temp 97.9°F | Resp 18 | Wt 145.4 lb

## 2022-07-13 DIAGNOSIS — Z923 Personal history of irradiation: Secondary | ICD-10-CM | POA: Insufficient documentation

## 2022-07-13 DIAGNOSIS — Z803 Family history of malignant neoplasm of breast: Secondary | ICD-10-CM | POA: Diagnosis not present

## 2022-07-13 DIAGNOSIS — R197 Diarrhea, unspecified: Secondary | ICD-10-CM

## 2022-07-13 DIAGNOSIS — Z79899 Other long term (current) drug therapy: Secondary | ICD-10-CM | POA: Insufficient documentation

## 2022-07-13 DIAGNOSIS — C218 Malignant neoplasm of overlapping sites of rectum, anus and anal canal: Secondary | ICD-10-CM | POA: Insufficient documentation

## 2022-07-13 DIAGNOSIS — C210 Malignant neoplasm of anus, unspecified: Secondary | ICD-10-CM

## 2022-07-13 DIAGNOSIS — D500 Iron deficiency anemia secondary to blood loss (chronic): Secondary | ICD-10-CM

## 2022-07-13 DIAGNOSIS — D509 Iron deficiency anemia, unspecified: Secondary | ICD-10-CM | POA: Diagnosis not present

## 2022-07-13 DIAGNOSIS — R918 Other nonspecific abnormal finding of lung field: Secondary | ICD-10-CM | POA: Diagnosis not present

## 2022-07-13 DIAGNOSIS — Z9221 Personal history of antineoplastic chemotherapy: Secondary | ICD-10-CM | POA: Insufficient documentation

## 2022-07-13 LAB — CBC WITH DIFFERENTIAL/PLATELET
Abs Immature Granulocytes: 0.01 10*3/uL (ref 0.00–0.07)
Basophils Absolute: 0 10*3/uL (ref 0.0–0.1)
Basophils Relative: 1 %
Eosinophils Absolute: 0.6 10*3/uL — ABNORMAL HIGH (ref 0.0–0.5)
Eosinophils Relative: 13 %
HCT: 34.8 % — ABNORMAL LOW (ref 36.0–46.0)
Hemoglobin: 11.6 g/dL — ABNORMAL LOW (ref 12.0–15.0)
Immature Granulocytes: 0 %
Lymphocytes Relative: 31 %
Lymphs Abs: 1.3 10*3/uL (ref 0.7–4.0)
MCH: 29.4 pg (ref 26.0–34.0)
MCHC: 33.3 g/dL (ref 30.0–36.0)
MCV: 88.1 fL (ref 80.0–100.0)
Monocytes Absolute: 0.4 10*3/uL (ref 0.1–1.0)
Monocytes Relative: 9 %
Neutro Abs: 2.1 10*3/uL (ref 1.7–7.7)
Neutrophils Relative %: 46 %
Platelets: 280 10*3/uL (ref 150–400)
RBC: 3.95 MIL/uL (ref 3.87–5.11)
RDW: 12.2 % (ref 11.5–15.5)
WBC: 4.4 10*3/uL (ref 4.0–10.5)
nRBC: 0 % (ref 0.0–0.2)

## 2022-07-13 LAB — COMPREHENSIVE METABOLIC PANEL
ALT: 18 U/L (ref 0–44)
AST: 21 U/L (ref 15–41)
Albumin: 4.1 g/dL (ref 3.5–5.0)
Alkaline Phosphatase: 47 U/L (ref 38–126)
Anion gap: 10 (ref 5–15)
BUN: 10 mg/dL (ref 6–20)
CO2: 24 mmol/L (ref 22–32)
Calcium: 8.9 mg/dL (ref 8.9–10.3)
Chloride: 101 mmol/L (ref 98–111)
Creatinine, Ser: 0.79 mg/dL (ref 0.44–1.00)
GFR, Estimated: >60 mL/min (ref >60)
Glucose, Bld: 94 mg/dL (ref 70–99)
Potassium: 3.8 mmol/L (ref 3.5–5.1)
Sodium: 135 mmol/L (ref 135–145)
Total Bilirubin: 0.3 mg/dL (ref 0.3–1.2)
Total Protein: 7.6 g/dL (ref 6.5–8.1)

## 2022-07-13 NOTE — Assessment & Plan Note (Signed)
Non specific. CT also showed patchy consolidation of right lung, clinically patient denies any sob, chest pain or cough.  repeat CT In May/June 2024 to follow up

## 2022-07-13 NOTE — Progress Notes (Signed)
Hematology/Oncology Progress note [phone] [phone]        CHIEF COMPLAINTS/PURPOSE OF CONSULTATION:  Anal squamous carcinoma  ASSESSMENT & PLAN:   Cancer Staging  Anal squamous cell carcinoma (HCC) Staging form: Anus, AJCC V9 - Clinical stage from 01/26/2022: Stage IIIC (cT4, cN1, cM0) - Signed by Rickard Patience, MD on 02/06/2022   Anal squamous cell carcinoma (HCC) At least in situ squamous cell carcinoma, however her imaging findings are more consistent with at least Stage III anal squamous cell carcinoma.  Labs are reviewed and discussed with patient. S/p concurrent radiation with  5-FU/mitomycin C  She is clinically doing well.  Recommend her to continue follow up with surgery Dr. Cliffton Asters for DRE/anoscopy every 6 months.  Recommend annual CT chest abdomen pelvis w contrast.    IDA (iron deficiency anemia) Hemoglobin has improved  Lung nodules Non specific. CT also showed patchy consolidation of right lung, clinically patient denies any sob, chest pain or cough.  repeat CT In May/June 2024 to follow up   Diarrhea Due to prevous radiation Recommend imodium PRN as instructed.    Follow up in December 2024 All questions were answered. The patient knows to call the clinic with any problems, questions or concerns.  Rickard Patience, MD, PhD [facility] Hematology Oncology 07/13/2022     HISTORY OF PRESENTING ILLNESS:  Cheryl Goodwin 62 y.o. female presents to establish care for Anal squamous carcinoma I have reviewed her chart and materials related to her cancer extensively and collaborated history with the patient. Summary of oncologic history is as follows: Oncology History  Anal squamous cell carcinoma (HCC)  01/18/2022 Procedure   Patient self palpated rectal/anal mass.  Denies any difficulty passing bowel movements, blood in the stool.  01/18/2022, colonoscopy showed anal mass 0 to 1 cm from the anal verge.  Likely malignant tumor in the distal  rectum, biopsied.  Nonbleeding internal hemorrhoids.   01/26/2022 Initial Diagnosis   Anal squamous cell carcinoma (HCC)  -Biopsy of the distal rectum mass showed keratinizing moderately to poorly differentiated squamous cell carcinoma at least in situ.  Superficial biopsies, invasion cannot be accessed.   01/26/2022 Cancer Staging   Staging form: Anus, AJCC V9 - Clinical stage from 01/26/2022: Stage IIIC (cT4, cN1, cM0) - Signed by Rickard Patience, MD on 02/06/2022 Stage prefix: Initial diagnosis   01/29/2022 Imaging   CT chest abdomen w contrast  1. No evidence of metastatic disease in the abdomen. 2. No definite findings of metastatic disease in the chest. Two solid pulmonary nodules, largest 0.5 cm in the right lower lobe.Recommend attention on follow-up chest CT in 3 months. 3. Mild patchy consolidation in the posterior right upper lobe with mild to moderate patchy ground-glass opacities in the right upper,right middle and right lower lobes, most suggestive of a mild multilobar pneumonia. 4. Moderate hiatal hernia. 5. Moderate to severe L1 vertebral compression fracture, chronic appearing.   02/03/2022 Imaging   MR pelvis wo contrast 1. Anorectal mass extending from the anterior and lateral LEFT low rectum through the anal canal and involving the LEFT sphincter complex to include the intersphincteric plane with abutment and potential early involvement of external sphincter. Also with suspicion for early encroachment upon the lower rectovaginal plane with extension towards the lower vagina. 2. Signs of LEFT inguinal nodal disease. Other small irregular lymph nodes particularly on the LEFT suspicious with a single necrotic appearing node that is highly suspicious for if not diagnostic of inguinal nodal involvement. Note that the full  pelvis is not assessed on this anorectal focused evaluation. No adenopathy seen along the pelvic sidewalls or about the mesorectum.      02/11/2022 Procedure    Medi port placed by IR   03/08/2022 -  Chemotherapy   Patient is on Treatment Plan : ANUS Mitomycin D1,28 + 5FU D1-4, 28-31 q32d     03/09/2022 - 04/30/2022 Radiation Therapy   Concurrent radiation and chemotherapy    Patient is widowed and current not sexually active. Denies smoking or alcohol use. Denies any abdominal pain, cough, shortness of breath nausea vomiting.  Denies any blood in the stool.    INTERVAL HISTORY Cheryl Goodwin is a 62 y.o. female who has above history reviewed by me today presents for follow up visit for anal cancer and iron deficiency anemia.   Overall she tolerates well. rectal/anal burning has resolved. Still has intermittent diarrhea  No fever, chills, abdominal pain. 07/09/22 She was seen by Dr. Cliffton Asters and anoscopy examination showed No palpable or visible evidence of any anal canal masses or disease.  Denies cough, chest pain, abdominal pain.     MEDICAL HISTORY:  Past Medical History:  Diagnosis Date   Depression    GERD (gastroesophageal reflux disease)    Hyperlipidemia     SURGICAL HISTORY: Past Surgical History:  Procedure Laterality Date   AUGMENTATION MAMMAPLASTY Bilateral 2005   with lift   BACK SURGERY  09/24/2014   L1 fracture, [facility]   BREAST ENHANCEMENT SURGERY  2007   COLONOSCOPY  2013   ENDOMETRIAL ABLATION  2013   ESOPHAGOGASTRODUODENOSCOPY (EGD) WITH PROPOFOL N/A 07/08/2016   Procedure: ESOPHAGOGASTRODUODENOSCOPY (EGD) WITH PROPOFOL;  Surgeon: Midge Minium, MD;  Location[facility];  Service: Endoscopy;  Laterality: N/A;   IR IMAGING GUIDED PORT INSERTION  02/11/2022   IR RADIOLOGIST EVAL & MGMT  04/26/2022   IR RADIOLOGIST EVAL & MGMT  04/29/2022   IR RADIOLOGIST EVAL & MGMT  05/07/2022   IR REMOVAL TUN ACCESS W/ PORT W/O FL MOD SED  04/21/2022   STOMACH SURGERY  2006   "Tummy Tuck"   TUBAL LIGATION      SOCIAL HISTORY: Social History   Socioeconomic History   Marital status: Widowed    Spouse name: Not on file    Number of children: 2   Years of education: Not on file   Highest education level: Not on file  Occupational History   Occupation: Unemployed  Tobacco Use   Smoking status: Never   Smokeless tobacco: Never  Substance and Sexual Activity   Alcohol use: Yes    Comment: 10 beers/month   Drug use: No   Sexual activity: Not on file  Other Topics Concern   Not on file  Social History Narrative   Not on file   Social Determinants of Health   Financial Resource Strain: Not on file  Food Insecurity: Not on file  Transportation Needs: Not on file  Physical Activity: Not on file  Stress: Not on file  Social Connections: Not on file  Intimate Partner Violence: Not on file    FAMILY HISTORY: Family History  Problem Relation Age of Onset   Stroke Mother    Breast cancer Mother    Emphysema Father        was a smoker   COPD Sister        was a smoker   Cancer Brother     ALLERGIES:  has No Known Allergies.  MEDICATIONS:  Current Outpatient Medications  Medication  Sig Dispense Refill   buPROPion (WELLBUTRIN XL) 300 MG 24 hr tablet Take 300 mg by mouth daily.     esomeprazole (NEXIUM) 40 MG capsule Take by mouth.     loperamide (IMODIUM) 2 MG capsule Take 1 capsule (2 mg total) by mouth See admin instructions. Initial: 4 mg, followed by 2 mg after each loose stool; maximum: 16 mg/day 30 capsule 0   lovastatin (MEVACOR) 20 MG tablet SMARTSIG:1 Tablet(s) By Mouth Every Evening     omeprazole (PRILOSEC) 40 MG capsule Take 1 capsule (40 mg total) by mouth daily. 30 capsule 3   ondansetron (ZOFRAN) 8 MG tablet Take 1 tablet (8 mg total) by mouth every 8 (eight) hours as needed for nausea or vomiting. 30 tablet 1   prochlorperazine (COMPAZINE) 10 MG tablet Take 1 tablet (10 mg total) by mouth every 6 (six) hours as needed for nausea or vomiting. 30 tablet 1   sertraline (ZOLOFT) 100 MG tablet Take 150 mg by mouth daily.     silver sulfADIAZINE (SILVADENE) 1 % cream Apply 1 Application  topically daily. 50 g 0   simvastatin (ZOCOR) 20 MG tablet Take 20 mg by mouth every evening.     traMADol (ULTRAM) 50 MG tablet Take 1 tablet (50 mg total) by mouth every 6 (six) hours as needed. 30 tablet 0   lidocaine-prilocaine (EMLA) cream Apply to affected area once (Patient not taking: Reported on 04/20/2022) 30 g 3   magic mouthwash (multi-ingredient) oral suspension Swish and swallow 5-10 mLs by mouth 4 (four) times daily as needed for mouth pain. (Patient not taking: Reported on 04/20/2022) 480 mL 1   magic mouthwash w/lidocaine SOLN Take 5 mLs by mouth 4 (four) times daily as needed for mouth pain. Sig: Swish/Swallow 5-10 ml four times a day as needed. Dispense 480 ml. 1RF (Patient not taking: Reported on 04/20/2022) 480 mL 1   sulfamethoxazole-trimethoprim (BACTRIM DS) 800-160 MG tablet Take 1 tablet by mouth 2 (two) times daily. (Patient not taking: Reported on 07/13/2022) 20 tablet 0   triamcinolone cream (KENALOG) 0.1 % Apply 1 Application topically 2 (two) times daily. To area affected by radiation (Patient not taking: Reported on 07/13/2022) 80 g 0   No current facility-administered medications for this visit.    Review of Systems  Constitutional:  Negative for appetite change, chills, fatigue and fever.  HENT:   Negative for hearing loss and voice change.   Eyes:  Negative for eye problems.  Respiratory:  Negative for chest tightness and cough.   Cardiovascular:  Negative for chest pain.  Gastrointestinal:  Negative for abdominal distention, abdominal pain and blood in stool.  Endocrine: Negative for hot flashes.  Genitourinary:  Negative for difficulty urinating and frequency.   Musculoskeletal:  Negative for arthralgias.  Skin:  Negative for itching and rash.  Neurological:  Negative for extremity weakness.  Hematological:  Negative for adenopathy.  Psychiatric/Behavioral:  Negative for confusion.      PHYSICAL EXAMINATION: ECOG PERFORMANCE STATUS: 0 -  Asymptomatic  Vitals:   07/13/22 1432  BP: 104/82  Pulse: 78  Resp: 18  Temp: 97.9 F (36.6 C)   Filed Weights   07/13/22 1432  Weight: 145 lb 6.4 oz (66 kg)    Physical Exam Constitutional:      General: She is not in acute distress.    Appearance: She is not diaphoretic.  HENT:     Head: Normocephalic.  Eyes:     General: No scleral icterus.  Pupils: Pupils are equal, round, and reactive to light.  Cardiovascular:     Rate and Rhythm: Normal rate.  Pulmonary:     Effort: Pulmonary effort is normal. No respiratory distress.  Abdominal:     General: There is no distension.     Palpations: Abdomen is soft.     Tenderness: There is no abdominal tenderness.  Musculoskeletal:        General: Normal range of motion.     Cervical back: Normal range of motion and neck supple.  Skin:    General: Skin is warm and dry.     Findings: No erythema.  Neurological:     Mental Status: She is alert and oriented to person, place, and time.     Cranial Nerves: No cranial nerve deficit.     Motor: No abnormal muscle tone.     Coordination: Coordination normal.  Psychiatric:        Mood and Affect: Mood and affect normal.      LABORATORY DATA:  I have reviewed the data as listed    Latest Ref Rng & Units 07/13/2022    2:08 PM 04/20/2022   11:35 AM 04/05/2022   12:51 PM  CBC  WBC 4.0 - 10.5 K/uL 4.4  3.5  5.4   Hemoglobin 12.0 - 15.0 g/dL 84.1  9.4  32.4   Hematocrit 36.0 - 46.0 % 34.8  28.6  31.8   Platelets 150 - 400 K/uL 280  144  274       Latest Ref Rng & Units 07/13/2022    2:08 PM 04/20/2022   11:35 AM 04/05/2022   12:51 PM  CMP  Glucose 70 - 99 mg/dL 94  92  401   BUN 6 - 20 mg/dL 10  10  10    Creatinine 0.44 - 1.00 mg/dL 0.27  2.53  6.64   Sodium 135 - 145 mmol/L 135  134  132   Potassium 3.5 - 5.1 mmol/L 3.8  3.5  4.0   Chloride 98 - 111 mmol/L 101  97  98   CO2 22 - 32 mmol/L 24  25  23    Calcium 8.9 - 10.3 mg/dL 8.9  8.8  8.9   Total Protein 6.5 - 8.1 g/dL  7.6  8.1  7.7   Total Bilirubin 0.3 - 1.2 mg/dL 0.3  0.6  0.2   Alkaline Phos 38 - 126 U/L 47  52  48   AST 15 - 41 U/L 21  18  26    ALT 0 - 44 U/L 18  15  17       RADIOGRAPHIC STUDIES: I have personally reviewed the radiological images as listed and agreed with the findings in the report. No results found.

## 2022-07-13 NOTE — Assessment & Plan Note (Signed)
Hemoglobin has improved.

## 2022-07-13 NOTE — Assessment & Plan Note (Addendum)
Due to prevous radiation Recommend imodium PRN as instructed.

## 2022-07-13 NOTE — Assessment & Plan Note (Addendum)
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  Labs are reviewed and discussed with patient. S/p concurrent radiation with  5-FU/mitomycin C  She is clinically doing well.  Recommend her to continue follow up with surgery Dr. Cliffton Asters for DRE/anoscopy every 6 months.  Recommend annual CT chest abdomen pelvis w contrast.

## 2022-07-14 ENCOUNTER — Other Ambulatory Visit: Payer: Self-pay

## 2022-08-02 ENCOUNTER — Encounter: Payer: Self-pay | Admitting: Oncology

## 2022-08-04 ENCOUNTER — Ambulatory Visit
Admission: RE | Admit: 2022-08-04 | Discharge: 2022-08-04 | Disposition: A | Discharge status: Home / Self Care | Payer: Medicaid Other | Source: Ambulatory Visit | Attending: Oncology | Admitting: Oncology

## 2022-08-04 DIAGNOSIS — R918 Other nonspecific abnormal finding of lung field: Secondary | ICD-10-CM | POA: Diagnosis present

## 2022-09-15 NOTE — Patient Instructions (Addendum)
SURGICAL WAITING ROOM VISITATION  Patients having surgery or a procedure may have no more than 2 support people in the waiting area - these visitors may rotate.    Children under the age of 76 must have an adult with them who is not the patient.  Due to an increase in RSV and influenza rates and associated hospitalizations, children ages 77 and under may not visit patients in [facility] hospitals.  If the patient needs to stay at the hospital during part of their recovery, the visitor guidelines for inpatient rooms apply. Pre-op nurse will coordinate an appropriate time for 1 support person to accompany patient in pre-op.  This support person may not rotate.    Please refer to the [facility] website for the visitor guidelines for Inpatients (after your surgery is over and you are in a regular room).       Your procedure is scheduled on: 09/30/22   Report to [facility] Main Entrance    Report to admitting at  7:15 AM   Call this number if you have problems the morning of surgery [phone]   Do not eat food or drink liquids :After Midnight.  FOLLOW BOWEL PREP AND ANY ADDITIONAL PRE OP INSTRUCTIONS YOU RECEIVED FROM YOUR SURGEON'S OFFICE!!!     Oral Hygiene is also important to reduce your risk of infection.                                    Remember - BRUSH YOUR TEETH THE MORNING OF SURGERY WITH YOUR REGULAR TOOTHPASTE    Take these medicines the morning of surgery with sips of water: wellbutrin, esomeprazole, Lovastatin, Omeprazole, Sertraline, Simvastatin                               You may not have any metal on your body including hair pins, jewelry, and body piercing             Do not wear make-up, lotions, powders, perfumes, or deodorant  Do not wear nail polish including gel and S&S, artificial/acrylic nails, or any other type of covering on natural nails including finger and toenails. If you have artificial nails, gel coating, etc. that needs to be removed  by a nail salon please have this removed prior to surgery or surgery may need to be canceled/ delayed if the surgeon/ anesthesia feels like they are unable to be safely monitored.   Do not shave  48 hours prior to surgery.    Do not bring valuables to the hospital. [facility] IS NOT             RESPONSIBLE   FOR VALUABLES.   Contacts, glasses, dentures or bridgework may not be worn into surgery.  DO NOT BRING YOUR HOME MEDICATIONS TO THE HOSPITAL. PHARMACY WILL DISPENSE MEDICATIONS LISTED ON YOUR MEDICATION LIST TO YOU DURING YOUR ADMISSION IN THE HOSPITAL!    Patients discharged on the day of surgery will not be allowed to drive home.  Someone NEEDS to stay with you for the first 24 hours after anesthesia.   Special Instructions: Bring a copy of your healthcare power of attorney and living will documents the day of surgery if you haven't scanned them before.              Please read over the following fact sheets you  were given: IF YOU HAVE QUESTIONS ABOUT YOUR PRE-OP INSTRUCTIONS PLEASE CALL [phone] Rosey [facility]   If you received a COVID test during your pre-op visit  it is requested that you wear a mask when out in public, stay away from anyone that may not be feeling well and notify your surgeon if you develop symptoms. If you test positive for Covid or have been in contact with anyone that has tested positive in the last 10 days please notify you surgeon.    [facility] - Preparing for Surgery Before surgery, you can play an important role.  Because skin is not sterile, your skin needs to be as free of germs as possible.  You can reduce the number of germs on your skin by washing with CHG (chlorahexidine gluconate) soap before surgery.  CHG is an antiseptic cleaner which kills germs and bonds with the skin to continue killing germs even after washing. Please DO NOT use if you have an allergy to CHG or antibacterial soaps.  If your skin becomes reddened/irritated stop using the CHG and  inform your nurse when you arrive at Short Stay. Do not shave (including legs and underarms) for at least 48 hours prior to the first CHG shower.  You may shave your face/neck.  Please follow these instructions carefully:  1.  Shower with CHG Soap the night before surgery and the  morning of surgery.  2.  If you choose to wash your hair, wash your hair first as usual with your normal  shampoo.  3.  After you shampoo, rinse your hair and body thoroughly to remove the shampoo.                             4.  Use CHG as you would any other liquid soap.  You can apply chg directly to the skin and wash.  Gently with a scrungie or clean washcloth.  5.  Apply the CHG Soap to your body ONLY FROM THE NECK DOWN.   Do   not use on face/ open                           Wound or open sores. Avoid contact with eyes, ears mouth and   genitals (private parts).                       Wash face,  Genitals (private parts) with your normal soap.             6.  Wash thoroughly, paying special attention to the area where your    surgery  will be performed.  7.  Thoroughly rinse your body with warm water from the neck down.  8.  DO NOT shower/wash with your normal soap after using and rinsing off the CHG Soap.                9.  Pat yourself dry with a clean towel.            10.  Wear clean pajamas.            11.  Place clean sheets on your bed the night of your first shower and do not  sleep with pets. Day of Surgery : Do not apply any lotions/deodorants the morning of surgery.  Please wear clean clothes to the hospital/surgery center.  FAILURE TO FOLLOW THESE INSTRUCTIONS MAY RESULT  IN THE CANCELLATION OF YOUR SURGERY  PATIENT SIGNATURE_________________________________  NURSE SIGNATURE__________________________________  ________________________________________________________________________

## 2022-09-15 NOTE — Progress Notes (Addendum)
COVID Vaccine received:  []  No [x]  Yes Date of any COVID positive Test in last 90 days: No PCP - Maudie Flakes MD Cardiologist -   Chest x-ray - 08/04/22 EPIC EKG -  09/20/22  Epic Stress Test - no ECHO - no Cardiac Cath - no  Bowel Prep - [x]  No  []   Yes ______  Pacemaker / ICD device [x]  No []  Yes   Spinal Cord Stimulator:[x]  No []  Yes       History of Sleep Apnea? [x]  No []  Yes   CPAP used?- [x]  No []  Yes    Does the patient monitor blood sugar?          [x]  No []  Yes  []  N/A  Patient has: [x]  NO Hx DM   []  Pre-DM                 []  DM1  []   DM2 Does patient have a [employer] or Dexacom? []  No []  Yes  no Fasting Blood Sugar Ranges- n/a Checks Blood Sugar _____ times a day n/a  GLP1 agonist / usual dose - No GLP1 instructions:  SGLT-2 inhibitors / usual dose - No SGLT-2 instructions:   Blood Thinner / Instructions:No Aspirin Instructions:No  Comments:   Activity level: Patient is able to climb a flight of stairs without difficulty; [x]  No CP  [x]  No SOB, but would have ___   Patient can perform ADLs without assistance.   Anesthesia review: Aortic athersclerosis, Anemia, Anal CA, Hiatal hernia  Patient denies shortness of breath, fever, cough and chest pain at PAT appointment.  Patient verbalized understanding and agreement to the Pre-Surgical Instructions that were given to them at this PAT appointment. Patient was also educated of the need to review these PAT instructions again prior to his/her surgery.I reviewed the appropriate phone numbers to call if they have any and questions or concerns.

## 2022-09-15 NOTE — Progress Notes (Signed)
Please send orders for PST visit 09/20/22

## 2022-09-20 ENCOUNTER — Encounter (HOSPITAL_COMMUNITY): Admission: RE | Admit: 2022-09-20 | Payer: Medicaid Other | Source: Ambulatory Visit

## 2022-09-20 ENCOUNTER — Other Ambulatory Visit: Payer: Self-pay

## 2022-09-20 ENCOUNTER — Encounter (HOSPITAL_COMMUNITY): Payer: Self-pay

## 2022-09-20 VITALS — BP 133/84 | HR 72 | Temp 98.3°F | Resp 16 | Ht 63.0 in | Wt 139.0 lb

## 2022-09-20 DIAGNOSIS — Z01818 Encounter for other preprocedural examination: Secondary | ICD-10-CM | POA: Diagnosis not present

## 2022-09-20 DIAGNOSIS — I2510 Atherosclerotic heart disease of native coronary artery without angina pectoris: Secondary | ICD-10-CM | POA: Diagnosis not present

## 2022-09-20 LAB — CBC
HCT: 35.8 % — ABNORMAL LOW (ref 36.0–46.0)
Hemoglobin: 11.5 g/dL — ABNORMAL LOW (ref 12.0–15.0)
MCH: 28.3 pg (ref 26.0–34.0)
MCHC: 32.1 g/dL (ref 30.0–36.0)
MCV: 88.2 fL (ref 80.0–100.0)
Platelets: 353 10*3/uL (ref 150–400)
RBC: 4.06 MIL/uL (ref 3.87–5.11)
RDW: 14 % (ref 11.5–15.5)
WBC: 4.8 10*3/uL (ref 4.0–10.5)
nRBC: 0 % (ref 0.0–0.2)

## 2022-09-20 LAB — BASIC METABOLIC PANEL
Anion gap: 11 (ref 5–15)
BUN: 7 mg/dL (ref 6–20)
CO2: 22 mmol/L (ref 22–32)
Calcium: 9.2 mg/dL (ref 8.9–10.3)
Chloride: 104 mmol/L (ref 98–111)
Creatinine, Ser: 0.74 mg/dL (ref 0.44–1.00)
GFR, Estimated: >60 mL/min (ref >60)
Glucose, Bld: 90 mg/dL (ref 70–99)
Potassium: 4.1 mmol/L (ref 3.5–5.1)
Sodium: 137 mmol/L (ref 135–145)

## 2022-09-29 ENCOUNTER — Ambulatory Visit: Payer: Self-pay | Admitting: Surgery

## 2022-09-30 ENCOUNTER — Encounter (HOSPITAL_COMMUNITY): Payer: Self-pay | Admitting: Surgery

## 2022-09-30 ENCOUNTER — Other Ambulatory Visit: Payer: Self-pay

## 2022-09-30 ENCOUNTER — Ambulatory Visit (HOSPITAL_COMMUNITY)
Admission: RE | Admit: 2022-09-30 | Discharge: 2022-09-30 | Disposition: A | Discharge status: Home / Self Care | Payer: Medicaid Other | Source: Ambulatory Visit | Attending: Surgery | Admitting: Surgery

## 2022-09-30 ENCOUNTER — Encounter (HOSPITAL_COMMUNITY)
Admission: RE | Disposition: A | Discharge status: Home / Self Care | Payer: Self-pay | Source: Ambulatory Visit | Attending: Surgery

## 2022-09-30 ENCOUNTER — Ambulatory Visit (HOSPITAL_COMMUNITY): Payer: Medicaid Other | Admitting: Anesthesiology

## 2022-09-30 ENCOUNTER — Ambulatory Visit (HOSPITAL_BASED_OUTPATIENT_CLINIC_OR_DEPARTMENT_OTHER): Payer: Medicaid Other | Admitting: Anesthesiology

## 2022-09-30 DIAGNOSIS — K629 Disease of anus and rectum, unspecified: Secondary | ICD-10-CM | POA: Diagnosis not present

## 2022-09-30 DIAGNOSIS — Z08 Encounter for follow-up examination after completed treatment for malignant neoplasm: Secondary | ICD-10-CM | POA: Insufficient documentation

## 2022-09-30 DIAGNOSIS — Z85048 Personal history of other malignant neoplasm of rectum, rectosigmoid junction, and anus: Secondary | ICD-10-CM | POA: Diagnosis not present

## 2022-09-30 DIAGNOSIS — K219 Gastro-esophageal reflux disease without esophagitis: Secondary | ICD-10-CM | POA: Diagnosis not present

## 2022-09-30 DIAGNOSIS — F32A Depression, unspecified: Secondary | ICD-10-CM | POA: Insufficient documentation

## 2022-09-30 HISTORY — PX: CYST EXCISION PERINEAL: SHX6278

## 2022-09-30 HISTORY — PX: RECTAL EXAM UNDER ANESTHESIA: SHX6399

## 2022-09-30 SURGERY — EXCISION, CYST, PERINEUM
Anesthesia: General

## 2022-09-30 MED ORDER — ACETAMINOPHEN 500 MG PO TABS
1000.0000 mg | ORAL_TABLET | ORAL | Status: AC
  Administered 2022-09-30: 1000 mg via ORAL
  Filled 2022-09-30: qty 2

## 2022-09-30 MED ORDER — LIDOCAINE HCL (CARDIAC) PF 100 MG/5ML IV SOSY
PREFILLED_SYRINGE | INTRAVENOUS | Status: DC | PRN
  Administered 2022-09-30: 60 mg via INTRAVENOUS

## 2022-09-30 MED ORDER — MIDAZOLAM HCL 5 MG/5ML IJ SOLN
INTRAMUSCULAR | Status: DC | PRN
  Administered 2022-09-30: 2 mg via INTRAVENOUS

## 2022-09-30 MED ORDER — LIDOCAINE HCL (PF) 2 % IJ SOLN
INTRAMUSCULAR | Status: AC
  Filled 2022-09-30: qty 5

## 2022-09-30 MED ORDER — MIDAZOLAM HCL 2 MG/2ML IJ SOLN
INTRAMUSCULAR | Status: AC
  Filled 2022-09-30: qty 2

## 2022-09-30 MED ORDER — TRAMADOL HCL 50 MG PO TABS: 50.0000 mg | ORAL_TABLET | Freq: Four times a day (QID) | ORAL | 0 refills | Status: AC | PRN

## 2022-09-30 MED ORDER — ONDANSETRON HCL 4 MG/2ML IJ SOLN
INTRAMUSCULAR | Status: AC
  Filled 2022-09-30: qty 2

## 2022-09-30 MED ORDER — PHENYLEPHRINE 80 MCG/ML (10ML) SYRINGE FOR IV PUSH (FOR BLOOD PRESSURE SUPPORT)
PREFILLED_SYRINGE | INTRAVENOUS | Status: DC | PRN
Start: 2022-09-30 — End: 2022-09-30
  Administered 2022-09-30: 160 ug via INTRAVENOUS
  Administered 2022-09-30: 120 ug via INTRAVENOUS
  Administered 2022-09-30 (×3): 160 ug via INTRAVENOUS

## 2022-09-30 MED ORDER — ORAL CARE MOUTH RINSE: 15.0000 mL | Freq: Once | OROMUCOSAL | Status: AC

## 2022-09-30 MED ORDER — 0.9 % SODIUM CHLORIDE (POUR BTL) OPTIME
TOPICAL | Status: DC | PRN
  Administered 2022-09-30: 1000 mL

## 2022-09-30 MED ORDER — FENTANYL CITRATE (PF) 100 MCG/2ML IJ SOLN
INTRAMUSCULAR | Status: AC
  Filled 2022-09-30: qty 2

## 2022-09-30 MED ORDER — BUPIVACAINE-EPINEPHRINE (PF) 0.25% -1:200000 IJ SOLN
INTRAMUSCULAR | Status: DC | PRN
  Administered 2022-09-30: 20 mL via PERINEURAL

## 2022-09-30 MED ORDER — ACETAMINOPHEN 10 MG/ML IV SOLN: 1000.0000 mg | Freq: Once | INTRAVENOUS | Status: DC | PRN

## 2022-09-30 MED ORDER — PHENYLEPHRINE 80 MCG/ML (10ML) SYRINGE FOR IV PUSH (FOR BLOOD PRESSURE SUPPORT)
PREFILLED_SYRINGE | INTRAVENOUS | Status: AC
  Filled 2022-09-30: qty 20

## 2022-09-30 MED ORDER — DEXAMETHASONE SODIUM PHOSPHATE 10 MG/ML IJ SOLN
INTRAMUSCULAR | Status: AC
  Filled 2022-09-30: qty 1

## 2022-09-30 MED ORDER — FENTANYL CITRATE PF 50 MCG/ML IJ SOSY: 25.0000 ug | PREFILLED_SYRINGE | INTRAMUSCULAR | Status: DC | PRN

## 2022-09-30 MED ORDER — BUPIVACAINE LIPOSOME 1.3 % IJ SUSP
INTRAMUSCULAR | Status: DC | PRN
  Administered 2022-09-30: 20 mL

## 2022-09-30 MED ORDER — ONDANSETRON HCL 4 MG/2ML IJ SOLN: 4.0000 mg | Freq: Once | INTRAMUSCULAR | Status: DC | PRN

## 2022-09-30 MED ORDER — OXYCODONE HCL 5 MG/5ML PO SOLN: 5.0000 mg | Freq: Once | ORAL | Status: DC | PRN

## 2022-09-30 MED ORDER — LACTATED RINGERS IV SOLN: INTRAVENOUS | Status: DC

## 2022-09-30 MED ORDER — BUPIVACAINE-EPINEPHRINE 0.25% -1:200000 IJ SOLN
INTRAMUSCULAR | Status: AC
  Filled 2022-09-30: qty 1

## 2022-09-30 MED ORDER — OXYCODONE HCL 5 MG PO TABS: 5.0000 mg | ORAL_TABLET | Freq: Once | ORAL | Status: DC | PRN

## 2022-09-30 MED ORDER — FENTANYL CITRATE (PF) 100 MCG/2ML IJ SOLN
INTRAMUSCULAR | Status: DC | PRN
  Administered 2022-09-30 (×2): 50 ug via INTRAVENOUS

## 2022-09-30 MED ORDER — FLEET ENEMA 7-19 GM/118ML RE ENEM
1.0000 | ENEMA | Freq: Once | RECTAL | Status: DC
  Filled 2022-09-30: qty 1

## 2022-09-30 MED ORDER — SODIUM CHLORIDE 0.9 % IV SOLN
2.0000 g | INTRAVENOUS | Status: AC
  Administered 2022-09-30: 2 g via INTRAVENOUS
  Filled 2022-09-30: qty 2

## 2022-09-30 MED ORDER — BUPIVACAINE LIPOSOME 1.3 % IJ SUSP
INTRAMUSCULAR | Status: AC
  Filled 2022-09-30: qty 20

## 2022-09-30 MED ORDER — DEXAMETHASONE SODIUM PHOSPHATE 4 MG/ML IJ SOLN
INTRAMUSCULAR | Status: DC | PRN
  Administered 2022-09-30: 10 mg via INTRAVENOUS

## 2022-09-30 MED ORDER — PROPOFOL 10 MG/ML IV BOLUS
INTRAVENOUS | Status: DC | PRN
  Administered 2022-09-30: 150 mg via INTRAVENOUS

## 2022-09-30 MED ORDER — BUPIVACAINE LIPOSOME 1.3 % IJ SUSP: 20.0000 mL | Freq: Once | INTRAMUSCULAR | Status: DC

## 2022-09-30 MED ORDER — CHLORHEXIDINE GLUCONATE 0.12 % MT SOLN
15.0000 mL | Freq: Once | OROMUCOSAL | Status: AC
  Administered 2022-09-30: 15 mL via OROMUCOSAL

## 2022-09-30 MED ORDER — ONDANSETRON HCL 4 MG/2ML IJ SOLN
INTRAMUSCULAR | Status: DC | PRN
  Administered 2022-09-30: 4 mg via INTRAVENOUS

## 2022-09-30 SURGICAL SUPPLY — 29 items
BAG COUNTER SPONGE SURGICOUNT (BAG) IMPLANT
BAG SPNG CNTER NS LX DISP (BAG)
BRIEF MESH DISP LRG (UNDERPADS AND DIAPERS) ×1 IMPLANT
ELECT NDL BLADE 2-5/6 (NEEDLE) ×1 IMPLANT
ELECT NEEDLE BLADE 2-5/6 (NEEDLE) ×1
ELECT REM PT RETURN 15FT ADLT (MISCELLANEOUS) ×1 IMPLANT
GAUZE PAD ABD 8X10 STRL (GAUZE/BANDAGES/DRESSINGS) IMPLANT
GAUZE SPONGE 4X4 12PLY STRL (GAUZE/BANDAGES/DRESSINGS) IMPLANT
GLOVE BIO SURGEON STRL SZ7.5 (GLOVE) ×1 IMPLANT
GLOVE INDICATOR 8.0 STRL GRN (GLOVE) ×1 IMPLANT
GOWN STRL REUS W/ TWL XL LVL3 (GOWN DISPOSABLE) ×2 IMPLANT
GOWN STRL REUS W/TWL XL LVL3 (GOWN DISPOSABLE) ×2
KIT BASIN OR (CUSTOM PROCEDURE TRAY) ×1 IMPLANT
KIT TURNOVER KIT A (KITS) IMPLANT
NDL HYPO 22X1.5 SAFETY MO (MISCELLANEOUS) ×1 IMPLANT
NEEDLE HYPO 22X1.5 SAFETY MO (MISCELLANEOUS) ×1
PACK GENERAL/GYN (CUSTOM PROCEDURE TRAY) ×1 IMPLANT
SHEARS HARMONIC 9CM CVD (BLADE) IMPLANT
SPIKE FLUID TRANSFER (MISCELLANEOUS) ×1 IMPLANT
SPONGE SURGIFOAM ABS GEL 100 (HEMOSTASIS) IMPLANT
SURGILUBE 2OZ TUBE FLIPTOP (MISCELLANEOUS) ×1 IMPLANT
SUT CHROMIC 2 0 SH (SUTURE) IMPLANT
SUT CHROMIC 3 0 SH 27 (SUTURE) IMPLANT
SUT MNCRL AB 4-0 PS2 18 (SUTURE) ×1 IMPLANT
SUT VIC AB 3-0 SH 27 (SUTURE) ×1
SUT VIC AB 3-0 SH 27X BRD (SUTURE) ×1 IMPLANT
SYR 20ML LL LF (SYRINGE) ×1 IMPLANT
TOWEL OR 17X26 10 PK STRL BLUE (TOWEL DISPOSABLE) ×1 IMPLANT
TOWEL OR NON WOVEN STRL DISP B (DISPOSABLE) ×1 IMPLANT

## 2022-09-30 NOTE — H&P (Signed)
CC: Here today for surgery  HPI: Cheryl Goodwin is an 62 y.o. female with history of GERD, depression, whom is seen in the office today as a referral by Dr. Margarita Mail for evaluation of anorectal cancer  Colonoscopy with Dr. Bing Plume 01/18/22: Anal mass 0 to 1 cm from the anal verge, biopsied. Nonbleeding internal hemorrhoids.  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 Superficial biopsies-invasion cannot be assessed.  CT Chest/abdomen/pelvis 01/29/22 -  1. No evidence of metastatic disease in the abdomen. 2. No definite findings of metastatic disease in the chest. Two solid pulmonary nodules, largest 0.5 cm in the right lower lobe. Recommend attention on follow-up chest CT in 3 months. 3. Mild patchy consolidation in the posterior right upper lobe with mild to moderate patchy ground-glass opacities in the right upper, right middle and right lower lobes, most suggestive of a mild multilobar pneumonia. 4. Moderate hiatal hernia. 5. Moderate to severe L1 vertebral compression fracture, chronic appearing.  Pelvic MRI 02/02/22 1. Anorectal mass extending from the anterior and lateral LEFT low rectum through the anal canal and involving the LEFT sphincter complex to include the intersphincteric plane with abutment and potential early involvement of external sphincter. Also with suspicion for early encroachment upon the lower rectovaginal plane with extension towards the lower vagina. 2. Signs of LEFT inguinal nodal disease. Other small irregular lymph nodes particularly on the LEFT suspicious with a single necrotic appearing node that is highly suspicious for if not diagnostic of inguinal nodal involvement. Note that the full pelvis is not assessed on this anorectal focused evaluation. No adenopathy seen along the pelvic sidewalls or about the mesorectum.  She is seeing Dr. Cathie Hoops - visit 11/28  She reports that she does have some  anal pain which she has had for about 3 to 4 months and that prompted her colonoscopy. She reports having had a normal colonoscopy at the age of 53. This was her second colonoscopy.  She is following with radonc Dr. Rushie Chestnut in [place]. Completed therapy 05/2022. She has noted some issues with fecal incontinence following her radiation treatment. She is also following with medical oncology, Dr. Cathie Hoops, for long-term surveillance of her anal cancer  Has done great since completing chemoradiation. No longer with any evident masses. No complaints at present aside from the noted issues above with some degree of fecal incontinence. Not currently taking any Imodium or fiber supplementation.  INTERVAL HX CT Chest 08/10/22 1. No evidence of metastatic disease in the chest. 2. Previously described small bilateral lower lobe pulmonary nodules are stable from 02/18/2022 CT and probably benign. Follow-up chest CT suggested in 6-12 months. 3. One vessel coronary atherosclerosis. 4. Moderate to large hiatal hernia. 5. Aortic Atherosclerosis (ICD10-I70.0).  She does have a known small tag in the left anterior position that she has noted has gotten bigger over the last month. She did have a trip down to [facility] and had some dietary indiscretions as well as some diarrhea. Unclear if provoked by this or not.  PMH: GERD, depression HLD  PSH: She denies any prior anorectal surgeries or procedures  FHx: Sister had breast cancer. Denies any known family history of colorectal, breast, endometrial or ovarian cancer  Social Hx: Denies use of tobacco/illicit drug. Social EtOH use 2-3 glasses/week. Her husband passed in 2014 from gastric cancer. She is here today alone.   She denies any changes in health or health history since we met in the office. No new  medications/allergies. She states she is ready for surgery today.  Past Medical History:  Diagnosis Date   Depression    GERD (gastroesophageal reflux  disease)    Hyperlipidemia     Past Surgical History:  Procedure Laterality Date   AUGMENTATION MAMMAPLASTY Bilateral 2005   with lift   BACK SURGERY  09/24/2014   L1 fracture, [facility]   BREAST ENHANCEMENT SURGERY  2007   COLONOSCOPY  2013   ENDOMETRIAL ABLATION  2013   ESOPHAGOGASTRODUODENOSCOPY (EGD) WITH PROPOFOL N/A 07/08/2016   Procedure: ESOPHAGOGASTRODUODENOSCOPY (EGD) WITH PROPOFOL;  Surgeon: Midge Minium, MD;  Location: [facility];  Service: Endoscopy;  Laterality: N/A;   IR IMAGING GUIDED PORT INSERTION  02/11/2022   IR RADIOLOGIST EVAL & MGMT  04/26/2022   IR RADIOLOGIST EVAL & MGMT  04/29/2022   IR RADIOLOGIST EVAL & MGMT  05/07/2022   IR REMOVAL TUN ACCESS W/ PORT W/O FL MOD SED  04/21/2022   LASIK     STOMACH SURGERY  2006   "Tummy Tuck"   TUBAL LIGATION      Family History  Problem Relation Age of Onset   Stroke Mother    Breast cancer Mother    Emphysema Father        was a smoker   COPD Sister        was a smoker   Cancer Brother     Social:  reports that she has never smoked. She has never used smokeless tobacco. She reports current alcohol use of about 1.0 standard drink of alcohol per week. She reports that she does not use drugs.  Allergies: No Known Allergies  Medications: I have reviewed the patient's current medications.  No results found for this or any previous visit (from the past 48 hour(s)).  No results found.   PE Blood pressure 107/68, pulse 74, temperature 97.6 F (36.4 C), temperature source Oral, resp. rate 16, height 5\' 3"  (1.6 m), weight 63 kg, SpO2 97%. Constitutional: NAD; conversant Eyes: Moist conjunctiva; no lid lag; anicteric Lungs: Normal respiratory effort CV: RRR Psychiatric: Appropriate affect  No results found for this or any previous visit (from the past 48 hour(s)).  No results found.  A/P: Cheryl Goodwin is an 62 y.o. female with hx of GERD, depression, HLD here for long-term/follow-up evaluation of  anal squamous cell carcinoma, stage IIIc ([id number])  CT CAP 01/29/22 -no definite findings of metastatic disease but 2 solid pulmonary nodules, largest being 5 mm in the right lower lobe. Attention on follow-up in 3 months.  MRI 02/03/22 -anorectal mass extending from the anterior and left lateral lower rectum through the anal canal involving the left sphincter complex and the intersphincteric plane with potential early involvement of external sphincter. Also suspicion for early encroachment on the lower rectovaginal plane. Left inguinal lymph node suspicious, single, necrotic.  cXRT with 5-FU/MitoC + XRT - completed 04/2022  CT Chest 08/04/22 NED  -Does have a left anterior tag-like structure that has grown in size over the last month. Unclear if this is a hemorrhoidal tag but given its location, I do think it be reasonable to at least excise it for pathologic purposes to ensure it is in fact benign.  -The anatomy and physiology of the anal canal was discussed with the patient with associated pictures. The pathophysiology of anal canal lesions/masses was discussed as well -We have reviewed options going forward including further observation vs surgery -excision of perianal mass, 1 x 1 cm; anorectal exam under anesthesia. -Given  location, increase in size and for peace of mind reasons, she would like to proceed with excision which I think is reasonable as well. -The planned procedure, material risks (including, but not limited to, pain, bleeding, infection, scarring, need for blood transfusion, damage to anal sphincter, incontinence of gas and/or stool, need for additional procedures, anal stenosis, rare cases of pelvic sepsis which in severe cases may require things like a colostomy, recurrence, pneumonia, heart attack, stroke, death) benefits and alternatives to surgery were discussed at length. I noted a good probability that the procedure would help improve their symptoms. The patient's questions were  answered to her satisfaction, she voiced understanding and elected to proceed with surgery. Additionally, we discussed typical postoperative expectations and the recovery process.  -Continue following with Dr. Cathie Hoops for surveillance imaging -Cont imodium  Marin Olp, MD [facility]

## 2022-09-30 NOTE — Discharge Instructions (Addendum)
ANORECTAL SURGERY: POST OP INSTRUCTIONS  DIET: Follow a light bland diet the first 24 hours after arrival home, such as soup, liquids, crackers, etc.  Be sure to include lots of fluids daily.  Avoid fast food or heavy meals as your are more likely to get nauseated.  Eat a low fat diet the next few days after surgery.   Some bleeding with bowel movements is expected for the first couple of days but this should stop in between bowel movements  Take your usually prescribed home medications unless otherwise directed. No foreign bodies per rectum for the next 3 months (enemas, etc)  PAIN CONTROL: It is helpful to take an over-the-counter pain medication regularly for the first few days/weeks.  Choose from the following that works best for you: Ibuprofen (Advil, etc) Three 200mg tabs every 6 hours as needed. Acetaminophen (Tylenol, etc) 500-650mg every 6 hours as needed NOTE: You may take both of these medications together - most patients find it most helpful when alternating between the two (i.e. Ibuprofen at 6am, tylenol at 9am, ibuprofen at 12pm ...) A  prescription for pain medication may have been prescribed for you at discharge.  Take your pain medication as prescribed.  If you are having problems/concerns with the prescription medicine, please call us for further advice.  Avoid getting constipated.  Between the surgery and the pain medications, it is common to experience some constipation.  Increasing fluid intake (64oz of water per day) and taking a fiber supplement (such as Metamucil, Citrucel, FiberCon) 1-2 times a day regularly will usually help prevent this problem from occurring.  Take Miralax (over the counter) 1-2x/day while taking a narcotic pain medication. If no bowel movement after 48hours, you may additionally take a laxative like a bottle of Milk of Magnesia which can be purchased over the counter. Avoid enemas.   Watch out for diarrhea.  If you have many loose bowel movements,  simplify your diet to bland foods.  Stop any stool softeners and decrease your fiber supplement. If this worsens or does not improve, please call us.  Wash / shower every day.  If you were discharged with a dressing, you may remove this the day after your surgery. You may shower normally, getting soap/water on your wound, particularly after bowel movements.  Soaking in a warm bath filled a couple inches ("Sitz bath") is a great way to clean the area after a bowel movement and many patients find it is a way to soothe the area.  ACTIVITIES as tolerated:   You may resume regular (light) daily activities beginning the next day--such as daily self-care, walking, climbing stairs--gradually increasing activities as tolerated.  If you can walk 30 minutes without difficulty, it is safe to try more intense activity such as jogging, treadmill, bicycling, low-impact aerobics, etc. Refrain from any heavy lifting or straining for the first 2 weeks after your procedure, particularly if your surgery was for hemorrhoids. Avoid activities that make your pain worse You may drive when you are no longer taking prescription pain medication, you can comfortably wear a seatbelt, and you can safely maneuver your car and apply brakes.  FOLLOW UP in our office Please call [facility] at [phone] to set up an appointment to see your surgeon in the office for a follow-up appointment approximately 2 weeks after your surgery. Make sure that you call for this appointment the day you arrive home to insure a convenient appointment time.  9. If you have disability or family leave forms   that need to be completed, you may have them completed by your primary care physician's office; for return to work instructions, please ask our office staff and they will be happy to assist you in obtaining this documentation   When to call us [phone]: Poor pain control Reactions / problems with new medications (rash/itching, etc)  Fever over  101.5 F (38.5 C) Inability to urinate Nausea/vomiting Worsening swelling or bruising Continued bleeding from incision. Increased pain, redness, or drainage from the incision  The clinic staff is available to answer your questions during regular business hours (8:30am-5pm).  Please don't hesitate to call and ask to speak to one of our nurses for clinical concerns.   A surgeon from [facility] is always on call at the hospitals   If you have a medical emergency, go to the nearest emergency room or call 911.   [facility] [address], [address], [place], [place]  [address] MAIN: [phone] FAX: [phone] [email or web address] 

## 2022-09-30 NOTE — Anesthesia Postprocedure Evaluation (Signed)
Anesthesia Post Note  Patient: Cheryl Goodwin  Procedure(s) Performed: excision of perianal mass ANORECTAL EXAM UNDER ANESTHESIA     Patient location during evaluation: PACU Anesthesia Type: General Level of consciousness: awake and alert Pain management: pain level controlled Vital Signs Assessment: post-procedure vital signs reviewed and stable Respiratory status: spontaneous breathing, nonlabored ventilation, respiratory function stable and patient connected to nasal cannula oxygen Cardiovascular status: blood pressure returned to baseline and stable Postop Assessment: no apparent nausea or vomiting Anesthetic complications: no   No notable events documented.  Last Vitals:  Vitals:   09/30/22 1030 09/30/22 1045  BP: 113/70   Pulse: 75 81  Resp: 14 20  Temp:    SpO2: 95% 95%    Last Pain:  Vitals:   09/30/22 1030  TempSrc:   PainSc: 0-No pain                 Mariann Barter

## 2022-09-30 NOTE — Anesthesia Preprocedure Evaluation (Signed)
Anesthesia Evaluation  Patient identified by MRN, date of birth, ID band Patient awake    Reviewed: Allergy & Precautions, NPO status , Patient's Chart, lab work & pertinent test results, reviewed documented beta blocker date and time   History of Anesthesia Complications Negative for: history of anesthetic complications  Airway Mallampati: II  TM Distance: >3 FB Neck ROM: Full    Dental no notable dental hx.    Pulmonary neg pulmonary ROS   breath sounds clear to auscultation       Cardiovascular (-) hypertension(-) angina (-) CAD, (-) Past MI, (-) Cardiac Stents and (-) CABG (-) Valvular Problems/Murmurs Rhythm:Regular Rate:Normal     Neuro/Psych  PSYCHIATRIC DISORDERS Anxiety Depression    negative neurological ROS     GI/Hepatic ,GERD  ,,  Endo/Other    Renal/GU      Musculoskeletal   Abdominal   Peds  Hematology  (+) Blood dyscrasia, anemia   Anesthesia Other Findings   Reproductive/Obstetrics                              Anesthesia Physical Anesthesia Plan  ASA: 2  Anesthesia Plan: General   Post-op Pain Management:    Induction:   PONV Risk Score and Plan: Treatment may vary due to age or medical condition  Airway Management Planned: LMA  Additional Equipment:   Intra-op Plan:   Post-operative Plan: Extubation in OR  Informed Consent: I have reviewed the patients History and Physical, chart, labs and discussed the procedure including the risks, benefits and alternatives for the proposed anesthesia with the patient or authorized representative who has indicated his/her understanding and acceptance.     Dental advisory given  Plan Discussed with: CRNA and Surgeon  Anesthesia Plan Comments:          Anesthesia Quick Evaluation

## 2022-09-30 NOTE — Anesthesia Procedure Notes (Signed)
Procedure Name: LMA Insertion Date/Time: 09/30/2022 9:31 AM  Performed by: Caren Macadam, CRNAPre-anesthesia Checklist: Patient identified, Emergency Drugs available, Suction available and Patient being monitored Patient Re-evaluated:Patient Re-evaluated prior to induction Oxygen Delivery Method: Circle system utilized Preoxygenation: Pre-oxygenation with 100% oxygen Induction Type: IV induction Ventilation: Mask ventilation without difficulty LMA: LMA inserted LMA Size: 4.0 Number of attempts: 1 Placement Confirmation: positive ETCO2 and breath sounds checked- equal and bilateral Tube secured with: Tape Dental Injury: Teeth and Oropharynx as per pre-operative assessment

## 2022-09-30 NOTE — Transfer of Care (Signed)
Immediate Anesthesia Transfer of Care Note  Patient: Cheryl Goodwin  Procedure(s) Performed: excision of perianal mass ANORECTAL EXAM UNDER ANESTHESIA  Patient Location: PACU  Anesthesia Type:General  Level of Consciousness: awake, alert , and oriented  Airway & Oxygen Therapy: Patient Spontanous Breathing and Patient connected to nasal cannula oxygen  Post-op Assessment: Report given to RN and Post -op Vital signs reviewed and stable  Post vital signs: Reviewed and stable  Last Vitals:  Vitals Value Taken Time  BP 125/92 09/30/22 1028  Temp    Pulse 78 09/30/22 1029  Resp 14 09/30/22 1029  SpO2 96 % 09/30/22 1029  Vitals shown include unfiled device data.  Last Pain:  Vitals:   09/30/22 0740  TempSrc: Oral  PainSc: 0-No pain         Complications: No notable events documented.

## 2022-09-30 NOTE — Op Note (Signed)
09/30/2022  10:21 AM  PATIENT:  Cheryl Goodwin  62 y.o. female  Patient Care Team: Cheryl Goodell, MD as PCP - General (Family Medicine) Jim Like, RN as Registered Nurse Scarlett Presto, RN (Inactive) as Registered Nurse Benita Gutter, RN as Oncology Nurse Navigator  PRE-OPERATIVE DIAGNOSIS:  Anal canal mass, history of anal cancer  POST-OPERATIVE DIAGNOSIS:  Same + anal canal lesion  PROCEDURE:   Transanal excision of anal canal lesion, 1 x 1 cm Excision of perianal mass 1 x 1 cm Anorectal exam under anesthesia  SURGEON:  Surgeon(s): Andria Meuse, MD  ASSISTANT: OR Staff  ANESTHESIA:   local and general  SPECIMEN:   Left anterior anal canal lesions (as 1 unit) Left anterior perianal mass  DISPOSITION OF SPECIMEN:  PATHOLOGY  COUNTS:  Sponge, needle, and instrument counts were reported correct x2 at conclusion.  EBL: 2 mL  PLAN OF CARE: Discharge to home after PACU  PATIENT DISPOSITION:  PACU - hemodynamically stable.  OR FINDINGS: Left anterior perianal lesion at the anal os.  Scar within the anal canal in the left anterior position consistent with known anal cancer history.  Nodular type changes to the left anterior position of the anal canal.  All lesions were fully excised.  The anal canal lesions were excised under anoscopic guidance.  DESCRIPTION: The patient was identified in the preoperative holding area and taken to the OR where she was placed on the operating room table. SCDs were placed.  General anesthesia was induced without difficulty. The patient was then positioned in high lithotomy with Allen stirrups. Pressure points were then evaluated and padded.  She was then prepped and draped in usual sterile fashion.  A surgical timeout was performed indicating the correct patient, procedure, and positioning.  A perianal block was performed using a dilute mixture of 0.25% Marcaine with epinephrine and Exparel.   After ascertaining that an  appropriate level of anesthesia had been achieved, a well lubricated digital rectal exam was performed. This demonstrated no palpable masses.  A Hill-Ferguson anoscope was into the anal canal and circumferential inspection demonstrated healthy appearing anoderm.  There is scar tissue evident within the left anterior position consistent with her known history.  There is also a palpable nodular type elevations within the anal canal at this location.  Externally at the anal os, there is a perianal mass is about 1 x 1 cm in size and not ulcerated.  It is mobile.    With the anoscope in place, I first excised the anal canal lesions.  This was done sharply and with electrocautery.  We fully excised these lesions which are in the left anterior position and in the vicinity of the scar from her prior anal cancer.  These were submitted as specimen.  We then directed our attention at the anal os lesion that is 1 x 1 cm in size.  This is soft and mobile.  It is elevated with a forcep.  It is excised sharply and the underlying sphincter muscle is teased away.  This was submitted separately for pathology.  Hemostasis is achieved electrocautery.  No sphincter muscle was then divided with this procedure.  Attention is then directed at wound closure.  We closed the anal canal excision site with a running 3-0 chromic suture.  The more external mass excision location was also closed similarly using a 3-0 chromic suture.  Additional local anesthetic is infiltrated surgical sites.  All sponge, needle, and instrument counts are reported  correct.  A dressing consisting of 4 x 4's, ABD, mesh underwear was placed.  She was taken out of lithotomy position, wake from anesthesia, extubated, transported recovery in satisfactory condition.  DISPOSITION: PACU in satisfactory condition.

## 2022-10-01 ENCOUNTER — Encounter (HOSPITAL_COMMUNITY): Payer: Self-pay | Admitting: Surgery

## 2022-11-10 ENCOUNTER — Ambulatory Visit: Payer: Medicaid Other | Admitting: Radiation Oncology

## 2022-12-23 ENCOUNTER — Other Ambulatory Visit: Payer: Self-pay | Admitting: Family Medicine

## 2022-12-23 DIAGNOSIS — Z1231 Encounter for screening mammogram for malignant neoplasm of breast: Secondary | ICD-10-CM

## 2023-01-04 ENCOUNTER — Inpatient Hospital Stay: Payer: Medicaid Other | Attending: Oncology | Admitting: Licensed Clinical Social Worker

## 2023-01-04 NOTE — Progress Notes (Signed)
[facility] CSW Progress Note  Clinical Social Worker contacted patient by phone to invite her to the [place] event on December 3rd.  Nyhla is helping raise her five grandchildren.  She is not sure if she will be able to attend but agreed to have CSW mail her the event information.Cheryl Baseman, LCSW Clinical Social Worker [facility]    Patient is participating in a Managed Medicaid Plan:  Yes

## 2023-01-11 ENCOUNTER — Ambulatory Visit
Admission: RE | Admit: 2023-01-11 | Discharge: 2023-01-11 | Disposition: A | Discharge status: Home / Self Care | Payer: Medicaid Other | Source: Ambulatory Visit | Attending: Family Medicine | Admitting: Family Medicine

## 2023-01-11 DIAGNOSIS — Z1231 Encounter for screening mammogram for malignant neoplasm of breast: Secondary | ICD-10-CM | POA: Insufficient documentation

## 2023-02-07 ENCOUNTER — Encounter: Payer: Self-pay | Admitting: Oncology

## 2023-02-08 ENCOUNTER — Encounter: Payer: Self-pay | Admitting: Oncology

## 2023-02-14 ENCOUNTER — Encounter: Payer: Self-pay | Admitting: Oncology

## 2023-02-15 ENCOUNTER — Ambulatory Visit
Admission: RE | Admit: 2023-02-15 | Discharge: 2023-02-15 | Disposition: A | Discharge status: Home / Self Care | Payer: Self-pay | Source: Ambulatory Visit | Attending: Oncology | Admitting: Oncology

## 2023-02-15 ENCOUNTER — Inpatient Hospital Stay: Payer: Self-pay | Attending: Oncology

## 2023-02-15 DIAGNOSIS — Z85048 Personal history of other malignant neoplasm of rectum, rectosigmoid junction, and anus: Secondary | ICD-10-CM | POA: Insufficient documentation

## 2023-02-15 DIAGNOSIS — C210 Malignant neoplasm of anus, unspecified: Secondary | ICD-10-CM | POA: Insufficient documentation

## 2023-02-15 DIAGNOSIS — D509 Iron deficiency anemia, unspecified: Secondary | ICD-10-CM | POA: Insufficient documentation

## 2023-02-15 DIAGNOSIS — R918 Other nonspecific abnormal finding of lung field: Secondary | ICD-10-CM | POA: Insufficient documentation

## 2023-02-15 DIAGNOSIS — Z923 Personal history of irradiation: Secondary | ICD-10-CM | POA: Insufficient documentation

## 2023-02-15 DIAGNOSIS — Z9221 Personal history of antineoplastic chemotherapy: Secondary | ICD-10-CM | POA: Insufficient documentation

## 2023-02-15 LAB — CBC WITH DIFFERENTIAL (CANCER CENTER ONLY)
Abs Immature Granulocytes: 0.01 10*3/uL (ref 0.00–0.07)
Basophils Absolute: 0.1 10*3/uL (ref 0.0–0.1)
Basophils Relative: 1 %
Eosinophils Absolute: 0.3 10*3/uL (ref 0.0–0.5)
Eosinophils Relative: 6 %
HCT: 32.8 % — ABNORMAL LOW (ref 36.0–46.0)
Hemoglobin: 11.1 g/dL — ABNORMAL LOW (ref 12.0–15.0)
Immature Granulocytes: 0 %
Lymphocytes Relative: 33 %
Lymphs Abs: 1.5 10*3/uL (ref 0.7–4.0)
MCH: 28.2 pg (ref 26.0–34.0)
MCHC: 33.8 g/dL (ref 30.0–36.0)
MCV: 83.5 fL (ref 80.0–100.0)
Monocytes Absolute: 0.4 10*3/uL (ref 0.1–1.0)
Monocytes Relative: 9 %
Neutro Abs: 2.2 10*3/uL (ref 1.7–7.7)
Neutrophils Relative %: 51 %
Platelet Count: 283 10*3/uL (ref 150–400)
RBC: 3.93 MIL/uL (ref 3.87–5.11)
RDW: 12.8 % (ref 11.5–15.5)
WBC Count: 4.4 10*3/uL (ref 4.0–10.5)
nRBC: 0 % (ref 0.0–0.2)

## 2023-02-15 LAB — CMP (CANCER CENTER ONLY)
ALT: 16 U/L (ref 0–44)
AST: 19 U/L (ref 15–41)
Albumin: 4.1 g/dL (ref 3.5–5.0)
Alkaline Phosphatase: 35 U/L — ABNORMAL LOW (ref 38–126)
Anion gap: 8 (ref 5–15)
BUN: 14 mg/dL (ref 6–20)
CO2: 24 mmol/L (ref 22–32)
Calcium: 9.2 mg/dL (ref 8.9–10.3)
Chloride: 102 mmol/L (ref 98–111)
Creatinine: 0.85 mg/dL (ref 0.44–1.00)
GFR, Estimated: >60 mL/min (ref >60)
Glucose, Bld: 74 mg/dL (ref 70–99)
Potassium: 4.1 mmol/L (ref 3.5–5.1)
Sodium: 134 mmol/L — ABNORMAL LOW (ref 135–145)
Total Bilirubin: 0.3 mg/dL (ref <1.2)
Total Protein: 7.1 g/dL (ref 6.5–8.1)

## 2023-02-15 LAB — IRON AND TIBC
Iron: 99 ug/dL (ref 28–170)
Saturation Ratios: 28 % (ref 10.4–31.8)
TIBC: 349 ug/dL (ref 250–450)
UIBC: 250 ug/dL

## 2023-02-15 LAB — FERRITIN: Ferritin: 37 ng/mL (ref 11–307)

## 2023-02-15 MED ORDER — IOHEXOL 300 MG/ML  SOLN
100.0000 mL | Freq: Once | INTRAMUSCULAR | Status: AC | PRN
  Administered 2023-02-15: 100 mL via INTRAVENOUS

## 2023-02-18 ENCOUNTER — Inpatient Hospital Stay (HOSPITAL_BASED_OUTPATIENT_CLINIC_OR_DEPARTMENT_OTHER): Payer: Self-pay | Admitting: Oncology

## 2023-02-18 ENCOUNTER — Encounter: Payer: Self-pay | Admitting: Oncology

## 2023-02-18 VITALS — BP 119/83 | HR 86 | Temp 97.5°F | Resp 18 | Wt 152.2 lb

## 2023-02-18 DIAGNOSIS — R918 Other nonspecific abnormal finding of lung field: Secondary | ICD-10-CM

## 2023-02-18 DIAGNOSIS — C210 Malignant neoplasm of anus, unspecified: Secondary | ICD-10-CM

## 2023-02-18 DIAGNOSIS — D500 Iron deficiency anemia secondary to blood loss (chronic): Secondary | ICD-10-CM

## 2023-02-18 NOTE — Progress Notes (Signed)
Hematology/Oncology Progress note [phone] [phone]        CHIEF COMPLAINTS/PURPOSE OF CONSULTATION:  Anal squamous carcinoma  ASSESSMENT & PLAN:   Cancer Staging  Anal squamous cell carcinoma (HCC) Staging form: Anus, AJCC V9 - Clinical stage from 01/26/2022: Stage IIIC (cT4, cN1, cM0) - Signed by Rickard Patience, MD on 02/06/2022   Anal squamous cell carcinoma (HCC) At least in situ squamous cell carcinoma, however her imaging findings are more consistent with at least Stage III anal squamous cell carcinoma.  Labs are reviewed and discussed with patient. S/p concurrent radiation with  5-FU/mitomycin C  She is clinically doing well.  Recommend her to continue follow up with surgery Dr. Cliffton Asters for DRE/anoscopy every 6 months.  CT scan results were reviewed and discussed with patient. Small nodular area seen in the anal region Repeat CT chest abdomen w contrast and MRI pelvis wo contrast in 6 months.     Lung nodules Stable nodules on CT  IDA (iron deficiency anemia) Lab Results  Component Value Date   HGB 11.1 (L) 02/15/2023   TIBC 349 02/15/2023   IRONPCTSAT 28 02/15/2023   FERRITIN 37 02/15/2023    Hemoglobin level is slightly worse. Iron panel is stable.  Monitor. Check B12 and folate at next visit.    Follow up in 6 months All questions were answered. The patient knows to call the clinic with any problems, questions or concerns.  Rickard Patience, MD, PhD [facility] Hematology Oncology 02/18/2023     HISTORY OF PRESENTING ILLNESS:  Cheryl Goodwin 62 y.o. female presents to establish care for Anal squamous carcinoma I have reviewed her chart and materials related to her cancer extensively and collaborated history with the patient. Summary of oncologic history is as follows: Oncology History  Anal squamous cell carcinoma (HCC)  01/18/2022 Procedure   Patient self palpated rectal/anal mass.  Denies any difficulty passing bowel movements, blood in  the stool.  01/18/2022, colonoscopy showed anal mass 0 to 1 cm from the anal verge.  Likely malignant tumor in the distal rectum, biopsied.  Nonbleeding internal hemorrhoids.   01/26/2022 Initial Diagnosis   Anal squamous cell carcinoma (HCC)  -Biopsy of the distal rectum mass showed keratinizing moderately to poorly differentiated squamous cell carcinoma at least in situ.  Superficial biopsies, invasion cannot be accessed.   01/26/2022 Cancer Staging   Staging form: Anus, AJCC V9 - Clinical stage from 01/26/2022: Stage IIIC (cT4, cN1, cM0) - Signed by Rickard Patience, MD on 02/06/2022 Stage prefix: Initial diagnosis   01/29/2022 Imaging   CT chest abdomen w contrast  1. No evidence of metastatic disease in the abdomen. 2. No definite findings of metastatic disease in the chest. Two solid pulmonary nodules, largest 0.5 cm in the right lower lobe.Recommend attention on follow-up chest CT in 3 months. 3. Mild patchy consolidation in the posterior right upper lobe with mild to moderate patchy ground-glass opacities in the right upper,right middle and right lower lobes, most suggestive of a mild multilobar pneumonia. 4. Moderate hiatal hernia. 5. Moderate to severe L1 vertebral compression fracture, chronic appearing.   02/03/2022 Imaging   MR pelvis wo contrast 1. Anorectal mass extending from the anterior and lateral LEFT low rectum through the anal canal and involving the LEFT sphincter complex to include the intersphincteric plane with abutment and potential early involvement of external sphincter. Also with suspicion for early encroachment upon the lower rectovaginal plane with extension towards the lower vagina. 2. Signs of LEFT inguinal nodal  disease. Other small irregular lymph nodes particularly on the LEFT suspicious with a single necrotic appearing node that is highly suspicious for if not diagnostic of inguinal nodal involvement. Note that the full pelvis is not assessed on this anorectal  focused evaluation. No adenopathy seen along the pelvic sidewalls or about the mesorectum.      02/11/2022 Procedure   Medi port placed by IR   03/08/2022 -  Chemotherapy   Patient is on Treatment Plan : ANUS Mitomycin D1,28 + 5FU D1-4, 28-31 q32d     03/09/2022 - 04/30/2022 Radiation Therapy   Concurrent radiation and chemotherapy    Patient is widowed and current not sexually active. Denies smoking or alcohol use. Denies any abdominal pain, cough, shortness of breath nausea vomiting.  Denies any blood in the stool.  07/09/22 She was seen by Dr. Cliffton Asters and anoscopy examination showed No palpable or visible evidence of any anal canal masses or disease.   INTERVAL HISTORY Cheryl Goodwin is a 62 y.o. female who has above history reviewed by me today presents for follow up visit for anal cancer and iron deficiency anemia.   Overall she tolerates well. rectal/anal burning has resolved. Still has intermittent diarrhea  10/19/22  She was seen by Dr. Cliffton Asters and anoscopy examination showed no palpable or visible evidence of any anal canal masses or disease.  Patient denies cough, chest pain, abdominal pain or rectal bleeding. Marland Kitchen     MEDICAL HISTORY:  Past Medical History:  Diagnosis Date   Depression    GERD (gastroesophageal reflux disease)    Hyperlipidemia     SURGICAL HISTORY: Past Surgical History:  Procedure Laterality Date   AUGMENTATION MAMMAPLASTY Bilateral 2005   with lift   BACK SURGERY  09/24/2014   L1 fracture, [facility]   BREAST ENHANCEMENT SURGERY  2007   COLONOSCOPY  2013   CYST EXCISION PERINEAL N/A 09/30/2022   Procedure: excision of perianal mass;  Surgeon: Andria Meuse, MD;  Location: [facility];  Service: General;  Laterality: N/A;  46   ENDOMETRIAL ABLATION  2013   ESOPHAGOGASTRODUODENOSCOPY (EGD) WITH PROPOFOL N/A 07/08/2016   Procedure: ESOPHAGOGASTRODUODENOSCOPY (EGD) WITH PROPOFOL;  Surgeon: Midge Minium, MD;  Location: [facility];  Service: Endoscopy;   Laterality: N/A;   IR IMAGING GUIDED PORT INSERTION  02/11/2022   IR RADIOLOGIST EVAL & MGMT  04/26/2022   IR RADIOLOGIST EVAL & MGMT  04/29/2022   IR RADIOLOGIST EVAL & MGMT  05/07/2022   IR REMOVAL TUN ACCESS W/ PORT W/O FL MOD SED  04/21/2022   LASIK     RECTAL EXAM UNDER ANESTHESIA N/A 09/30/2022   Procedure: ANORECTAL EXAM UNDER ANESTHESIA;  Surgeon: Andria Meuse, MD;  Location: [facility];  Service: General;  Laterality: N/A;   STOMACH SURGERY  2006   "Tummy Tuck"   TUBAL LIGATION      SOCIAL HISTORY: Social History   Socioeconomic History   Marital status: Widowed    Spouse name: Not on file   Number of children: 2   Years of education: Not on file   Highest education level: Not on file  Occupational History   Occupation: Unemployed  Tobacco Use   Smoking status: Never   Smokeless tobacco: Never  Substance and Sexual Activity   Alcohol use: Yes    Alcohol/week: 1.0 standard drink of alcohol    Types: 1 Cans of beer per week    Comment: 10 beers/month   Drug use: No   Sexual activity: Not  on file  Other Topics Concern   Not on file  Social History Narrative   Not on file   Social Drivers of Health   Financial Resource Strain: Low Risk  (09/01/2022)   Received from [facility]   Overall Financial Resource Strain (CARDIA)    Difficulty of Paying Living Expenses: Not very hard  Food Insecurity: Food Insecurity Present (09/01/2022)   [facility]   Hunger Vital Sign    Worried About Running Out of Food in the Last Year: Sometimes true    Ran Out of Food in the Last Year: Sometimes true  Transportation Needs: No Transportation Needs (09/01/2022)   Received from [facility] - Transportation    In the past 12 months, has lack of transportation kept you from medical appointments or from getting medications?: No    Lack of Transportation (Non-Medical): No  Physical Activity: Not on file   Stress: Not on file  Social Connections: Not on file  Intimate Partner Violence: Not on file    FAMILY HISTORY: Family History  Problem Relation Age of Onset   Stroke Mother    Breast cancer Mother    Emphysema Father        was a smoker   COPD Sister        was a smoker   Cancer Brother     ALLERGIES:  has no known allergies.  MEDICATIONS:  Current Outpatient Medications  Medication Sig Dispense Refill   buPROPion (WELLBUTRIN XL) 300 MG 24 hr tablet Take 300 mg by mouth daily.     esomeprazole (NEXIUM) 40 MG capsule Take 40 mg by mouth 2 (two) times daily.     lovastatin (MEVACOR) 20 MG tablet Take 20 mg by mouth at bedtime.     sertraline (ZOLOFT) 100 MG tablet Take 100 mg by mouth daily.     No current facility-administered medications for this visit.    Review of Systems  Constitutional:  Negative for appetite change, chills, fatigue and fever.  HENT:   Negative for hearing loss and voice change.   Eyes:  Negative for eye problems.  Respiratory:  Negative for chest tightness and cough.   Cardiovascular:  Negative for chest pain.  Gastrointestinal:  Negative for abdominal distention, abdominal pain and blood in stool.  Endocrine: Negative for hot flashes.  Genitourinary:  Negative for difficulty urinating and frequency.   Musculoskeletal:  Negative for arthralgias.  Skin:  Negative for itching and rash.  Neurological:  Negative for extremity weakness.  Hematological:  Negative for adenopathy.  Psychiatric/Behavioral:  Negative for confusion.      PHYSICAL EXAMINATION: ECOG PERFORMANCE STATUS: 0 - Asymptomatic  Vitals:   02/18/23 1213  BP: 119/83  Pulse: 86  Resp: 18  Temp: (!) 97.5 F (36.4 C)   Filed Weights   02/18/23 1213  Weight: 152 lb 3.2 oz (69 kg)    Physical Exam Constitutional:      General: She is not in acute distress.    Appearance: She is not diaphoretic.  HENT:     Head: Normocephalic.  Eyes:     General: No scleral  icterus. Cardiovascular:     Rate and Rhythm: Normal rate.  Pulmonary:     Effort: Pulmonary effort is normal. No respiratory distress.  Abdominal:     General: There is no distension.     Palpations: Abdomen is soft.     Tenderness: There is no abdominal  tenderness.  Musculoskeletal:        General: Normal range of motion.     Cervical back: Normal range of motion and neck supple.  Skin:    General: Skin is warm and dry.     Findings: No erythema.  Neurological:     Mental Status: She is alert and oriented to person, place, and time. Mental status is at baseline.     Motor: No abnormal muscle tone.  Psychiatric:        Mood and Affect: Mood and affect normal.      LABORATORY DATA:  I have reviewed the data as listed    Latest Ref Rng & Units 02/15/2023   12:26 PM 09/20/2022    1:13 PM 07/13/2022    2:08 PM  CBC  WBC 4.0 - 10.5 K/uL 4.4  4.8  4.4   Hemoglobin 12.0 - 15.0 g/dL 16.1  09.6  04.5   Hematocrit 36.0 - 46.0 % 32.8  35.8  34.8   Platelets 150 - 400 K/uL 283  353  280       Latest Ref Rng & Units 02/15/2023   12:26 PM 09/20/2022    1:13 PM 07/13/2022    2:08 PM  CMP  Glucose 70 - 99 mg/dL 74  90  94   BUN 6 - 20 mg/dL 14  7  10    Creatinine 0.44 - 1.00 mg/dL 4.09  8.11  9.14   Sodium 135 - 145 mmol/L 134  137  135   Potassium 3.5 - 5.1 mmol/L 4.1  4.1  3.8   Chloride 98 - 111 mmol/L 102  104  101   CO2 22 - 32 mmol/L 24  22  24    Calcium 8.9 - 10.3 mg/dL 9.2  9.2  8.9   Total Protein 6.5 - 8.1 g/dL 7.1   7.6   Total Bilirubin <1.2 mg/dL 0.3   0.3   Alkaline Phos 38 - 126 U/L 35   47   AST 15 - 41 U/L 19   21   ALT 0 - 44 U/L 16   18      RADIOGRAPHIC STUDIES: I have personally reviewed the radiological images as listed and agreed with the findings in the report. CT CHEST ABDOMEN PELVIS W CONTRAST Result Date: 02/17/2023 CLINICAL DATA:  Anal cancer follow up. Squamous cell carcinoma. * Tracking Code: BO * EXAM: CT CHEST, ABDOMEN, AND PELVIS WITH  CONTRAST TECHNIQUE: Multidetector CT imaging of the chest, abdomen and pelvis was performed following the standard protocol during bolus administration of intravenous contrast. RADIATION DOSE REDUCTION: This exam was performed according to the departmental dose-optimization program which includes automated exposure control, adjustment of the mA and/or kV according to patient size and/or use of iterative reconstruction technique. CONTRAST:  OMNIPAQUE IOHEXOL 300 MG/ML  SOLN COMPARISON:  Noncontrast chest CT 08/04/2022. MRI pelvis December 2023. Abdomen CT 01/29/2022. FINDINGS: CT CHEST FINDINGS Cardiovascular: The thoracic aorta has a normal course and caliber. Minimal atherosclerotic plaque. Trace pericardial fluid. Heart nonenlarged. Mediastinum/Nodes: No specific abnormal lymph node enlargement identified in the axillary regions, hilum or mediastinum. There is prominent lymph node of the thoracic inlet on the left side on series 2, image 6 today measuring 7 mm. Previously 7 mm as well. Preserved small thyroid gland. Large hiatal hernia. Lungs/Pleura: There is some linear opacity lung bases likely scar or atelectasis. No pleural effusion. Stable stable noncalcified nodule right lower lobe series 4, image 94 measuring 4 mm. Stable  3 mm nodule left lung base series 4, image 114. No new dominant lung nodule. Musculoskeletal: Slight curvature of the spine. Mild degenerative changes. Bilateral breast implants. Fixation hardware along the thoracolumbar junction with pedicle screws and vertical fixation rods. Stable compression of the L1. CT ABDOMEN PELVIS FINDINGS Hepatobiliary: Mild fatty liver infiltration. No space-occupying liver lesion. Patent portal vein. Gallbladder is present. Pancreas: Unremarkable. No pancreatic ductal dilatation or surrounding inflammatory changes. Spleen: Normal in size without focal abnormality. Adrenals/Urinary Tract: Adrenal glands are unremarkable. Kidneys are normal, without renal  calculi, focal lesion, or hydronephrosis. Bladder is unremarkable. Stomach/Bowel: Oral contrast was administered. Once again hiatal hernia seen. Otherwise stomach is nondilated. The small bowel overall has a normal course and caliber. Normal appendix in the right lower quadrant. Large bowel does not have oral contrast but has a moderate diffuse stool. The large bowel is nondilated. The rectum is decompressed. Grossly preserved contours of the anal canal. Minimal thickening identified. On the prior examination, MRI, there is a nodular mass along left side of the anal canal. Minimal nodularity today in the anal region on series 2, image 114. If needed dedicated MRI evaluation could be considered as clinically appropriate Vascular/Lymphatic: Aortic atherosclerosis. No enlarged abdominal or pelvic lymph nodes. Reproductive: Uterus is present. Stable cystic area in the right adnexa, likely ovarian measuring 2.3 cm. Other: No free air or free fluid. Musculoskeletal: Scattered degenerative changes of the spine. Again surgical hardware along the lumbar spine. Possible implant noted along the surface of the left gluteal muscle. Surgical changes along the anterior abdominal wall identified as well. IMPRESSION: No developing new mass lesion, fluid collection or lymph node enlargement. Stable prominent thoracic inlet lymph node on the left side. Stable tiny lung nodules. Recommend simple continued follow up in 6 months. Fatty liver infiltration. Previous anal lesion not clearly identified but this has areas not as well defined on CT scan as on MRI. Small nodular area seen in the anal region on series 2, image 144. Please correlate with clinical findings. Electronically Signed   By: Karen Kays M.D.   On: 02/17/2023 17:18

## 2023-02-18 NOTE — Assessment & Plan Note (Addendum)
Stable nodules on CT

## 2023-02-18 NOTE — Assessment & Plan Note (Signed)
Lab Results  Component Value Date   HGB 11.1 (L) 02/15/2023   TIBC 349 02/15/2023   IRONPCTSAT 28 02/15/2023   FERRITIN 37 02/15/2023    Hemoglobin level is slightly worse. Iron panel is stable.  Monitor. Check B12 and folate at next visit.

## 2023-02-18 NOTE — Assessment & Plan Note (Addendum)
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  Labs are reviewed and discussed with patient. S/p concurrent radiation with  5-FU/mitomycin C  She is clinically doing well.  Recommend her to continue follow up with surgery Dr. Cliffton Asters for DRE/anoscopy every 6 months.  CT scan results were reviewed and discussed with patient. Small nodular area seen in the anal region Repeat CT chest abdomen w contrast and MRI pelvis wo contrast in 6 months.

## 2023-02-18 NOTE — Progress Notes (Signed)
Pt here for follow up. No new concerns voiced.   

## 2023-02-20 ENCOUNTER — Other Ambulatory Visit: Payer: Self-pay

## 2023-05-06 ENCOUNTER — Encounter: Payer: Self-pay | Admitting: Oncology

## 2023-06-28 ENCOUNTER — Ambulatory Visit

## 2023-06-30 ENCOUNTER — Encounter: Payer: Self-pay | Admitting: Oncology

## 2023-07-04 ENCOUNTER — Ambulatory Visit: Payer: Self-pay

## 2023-07-04 ENCOUNTER — Encounter: Payer: Self-pay | Admitting: Oncology

## 2023-07-04 DIAGNOSIS — Z09 Encounter for follow-up examination after completed treatment for conditions other than malignant neoplasm: Secondary | ICD-10-CM | POA: Diagnosis present

## 2023-07-04 DIAGNOSIS — K449 Diaphragmatic hernia without obstruction or gangrene: Secondary | ICD-10-CM | POA: Diagnosis not present

## 2023-07-04 DIAGNOSIS — D509 Iron deficiency anemia, unspecified: Secondary | ICD-10-CM | POA: Diagnosis not present

## 2023-07-04 DIAGNOSIS — K5520 Angiodysplasia of colon without hemorrhage: Secondary | ICD-10-CM | POA: Diagnosis not present

## 2023-07-04 DIAGNOSIS — K627 Radiation proctitis: Secondary | ICD-10-CM | POA: Diagnosis not present

## 2023-07-04 DIAGNOSIS — K5730 Diverticulosis of large intestine without perforation or abscess without bleeding: Secondary | ICD-10-CM | POA: Diagnosis not present

## 2023-07-04 DIAGNOSIS — Z85048 Personal history of other malignant neoplasm of rectum, rectosigmoid junction, and anus: Secondary | ICD-10-CM | POA: Diagnosis not present

## 2023-07-04 DIAGNOSIS — K2970 Gastritis, unspecified, without bleeding: Secondary | ICD-10-CM | POA: Diagnosis not present

## 2023-07-04 DIAGNOSIS — K219 Gastro-esophageal reflux disease without esophagitis: Secondary | ICD-10-CM | POA: Diagnosis not present

## 2023-07-13 ENCOUNTER — Encounter: Payer: Self-pay | Admitting: Oncology

## 2023-08-19 ENCOUNTER — Other Ambulatory Visit: Payer: Medicaid Other

## 2023-08-19 ENCOUNTER — Ambulatory Visit: Payer: Medicaid Other

## 2023-08-22 ENCOUNTER — Inpatient Hospital Stay: Attending: Oncology

## 2023-08-22 ENCOUNTER — Encounter: Payer: Self-pay | Admitting: Oncology

## 2023-08-22 ENCOUNTER — Ambulatory Visit
Admission: RE | Admit: 2023-08-22 | Discharge: 2023-08-22 | Disposition: A | Discharge status: Home / Self Care | Payer: Self-pay | Source: Ambulatory Visit | Attending: Oncology | Admitting: Oncology

## 2023-08-22 ENCOUNTER — Ambulatory Visit
Admission: RE | Admit: 2023-08-22 | Discharge: 2023-08-22 | Disposition: A | Discharge status: Home / Self Care | Payer: Self-pay | Source: Ambulatory Visit | Attending: Oncology

## 2023-08-22 DIAGNOSIS — Z9221 Personal history of antineoplastic chemotherapy: Secondary | ICD-10-CM | POA: Diagnosis not present

## 2023-08-22 DIAGNOSIS — Z923 Personal history of irradiation: Secondary | ICD-10-CM | POA: Diagnosis not present

## 2023-08-22 DIAGNOSIS — D649 Anemia, unspecified: Secondary | ICD-10-CM | POA: Insufficient documentation

## 2023-08-22 DIAGNOSIS — Z85048 Personal history of other malignant neoplasm of rectum, rectosigmoid junction, and anus: Secondary | ICD-10-CM | POA: Insufficient documentation

## 2023-08-22 DIAGNOSIS — C210 Malignant neoplasm of anus, unspecified: Secondary | ICD-10-CM | POA: Diagnosis present

## 2023-08-22 LAB — RETIC PANEL
Immature Retic Fract: 13.3 % (ref 2.3–15.9)
RBC.: 3.92 MIL/uL (ref 3.87–5.11)
Retic Count, Absolute: 61.5 10*3/uL (ref 19.0–186.0)
Retic Ct Pct: 1.6 % (ref 0.4–3.1)
Reticulocyte Hemoglobin: 31.9 pg (ref >27.9)

## 2023-08-22 LAB — CMP (CANCER CENTER ONLY)
ALT: 18 U/L (ref 0–44)
AST: 24 U/L (ref 15–41)
Albumin: 4.1 g/dL (ref 3.5–5.0)
Alkaline Phosphatase: 41 U/L (ref 38–126)
Anion gap: 10 (ref 5–15)
BUN: 13 mg/dL (ref 8–23)
CO2: 21 mmol/L — ABNORMAL LOW (ref 22–32)
Calcium: 8.9 mg/dL (ref 8.9–10.3)
Chloride: 105 mmol/L (ref 98–111)
Creatinine: 0.83 mg/dL (ref 0.44–1.00)
GFR, Estimated: >60 mL/min (ref >60)
Glucose, Bld: 104 mg/dL — ABNORMAL HIGH (ref 70–99)
Potassium: 4 mmol/L (ref 3.5–5.1)
Sodium: 136 mmol/L (ref 135–145)
Total Bilirubin: 0.6 mg/dL (ref 0.0–1.2)
Total Protein: 7.3 g/dL (ref 6.5–8.1)

## 2023-08-22 LAB — CBC WITH DIFFERENTIAL (CANCER CENTER ONLY)
Abs Immature Granulocytes: 0.02 10*3/uL (ref 0.00–0.07)
Basophils Absolute: 0 10*3/uL (ref 0.0–0.1)
Basophils Relative: 1 %
Eosinophils Absolute: 0.3 10*3/uL (ref 0.0–0.5)
Eosinophils Relative: 8 %
HCT: 32.2 % — ABNORMAL LOW (ref 36.0–46.0)
Hemoglobin: 10.7 g/dL — ABNORMAL LOW (ref 12.0–15.0)
Immature Granulocytes: 1 %
Lymphocytes Relative: 31 %
Lymphs Abs: 1.3 10*3/uL (ref 0.7–4.0)
MCH: 27.3 pg (ref 26.0–34.0)
MCHC: 33.2 g/dL (ref 30.0–36.0)
MCV: 82.1 fL (ref 80.0–100.0)
Monocytes Absolute: 0.3 10*3/uL (ref 0.1–1.0)
Monocytes Relative: 7 %
Neutro Abs: 2.3 10*3/uL (ref 1.7–7.7)
Neutrophils Relative %: 52 %
Platelet Count: 290 10*3/uL (ref 150–400)
RBC: 3.92 MIL/uL (ref 3.87–5.11)
RDW: 13.2 % (ref 11.5–15.5)
WBC Count: 4.2 10*3/uL (ref 4.0–10.5)
nRBC: 0 % (ref 0.0–0.2)

## 2023-08-22 LAB — IRON AND TIBC
Iron: 96 ug/dL (ref 28–170)
Saturation Ratios: 27 % (ref 10.4–31.8)
TIBC: 357 ug/dL (ref 250–450)
UIBC: 261 ug/dL

## 2023-08-22 LAB — FERRITIN: Ferritin: 52 ng/mL (ref 11–307)

## 2023-08-22 LAB — VITAMIN B12: Vitamin B-12: 383 pg/mL (ref 180–914)

## 2023-08-22 LAB — FOLATE: Folate: 18.2 ng/mL (ref >5.9)

## 2023-08-22 MED ORDER — IOHEXOL 300 MG/ML  SOLN
100.0000 mL | Freq: Once | INTRAMUSCULAR | Status: AC | PRN
  Administered 2023-08-22: 100 mL via INTRAVENOUS

## 2023-08-29 ENCOUNTER — Encounter: Payer: Self-pay | Admitting: Oncology

## 2023-08-30 ENCOUNTER — Inpatient Hospital Stay: Payer: Medicaid Other | Attending: Oncology | Admitting: Oncology

## 2023-08-30 ENCOUNTER — Ambulatory Visit: Payer: Self-pay | Admitting: Oncology

## 2023-08-30 ENCOUNTER — Inpatient Hospital Stay

## 2023-08-30 ENCOUNTER — Encounter: Payer: Self-pay | Admitting: Oncology

## 2023-08-30 VITALS — BP 117/79 | HR 79 | Temp 96.4°F | Resp 18 | Wt 160.3 lb

## 2023-08-30 DIAGNOSIS — R918 Other nonspecific abnormal finding of lung field: Secondary | ICD-10-CM | POA: Diagnosis not present

## 2023-08-30 DIAGNOSIS — Z85048 Personal history of other malignant neoplasm of rectum, rectosigmoid junction, and anus: Secondary | ICD-10-CM | POA: Insufficient documentation

## 2023-08-30 DIAGNOSIS — R946 Abnormal results of thyroid function studies: Secondary | ICD-10-CM | POA: Insufficient documentation

## 2023-08-30 DIAGNOSIS — C210 Malignant neoplasm of anus, unspecified: Secondary | ICD-10-CM

## 2023-08-30 DIAGNOSIS — D509 Iron deficiency anemia, unspecified: Secondary | ICD-10-CM | POA: Diagnosis not present

## 2023-08-30 DIAGNOSIS — D649 Anemia, unspecified: Secondary | ICD-10-CM

## 2023-08-30 DIAGNOSIS — Z9221 Personal history of antineoplastic chemotherapy: Secondary | ICD-10-CM | POA: Diagnosis not present

## 2023-08-30 DIAGNOSIS — D500 Iron deficiency anemia secondary to blood loss (chronic): Secondary | ICD-10-CM | POA: Diagnosis not present

## 2023-08-30 DIAGNOSIS — Z923 Personal history of irradiation: Secondary | ICD-10-CM | POA: Diagnosis not present

## 2023-08-30 DIAGNOSIS — R7989 Other specified abnormal findings of blood chemistry: Secondary | ICD-10-CM | POA: Insufficient documentation

## 2023-08-30 LAB — CBC WITH DIFFERENTIAL/PLATELET
Abs Immature Granulocytes: 0.03 10*3/uL (ref 0.00–0.07)
Basophils Absolute: 0 10*3/uL (ref 0.0–0.1)
Basophils Relative: 1 %
Eosinophils Absolute: 0.3 10*3/uL (ref 0.0–0.5)
Eosinophils Relative: 7 %
HCT: 34.8 % — ABNORMAL LOW (ref 36.0–46.0)
Hemoglobin: 11.5 g/dL — ABNORMAL LOW (ref 12.0–15.0)
Immature Granulocytes: 1 %
Lymphocytes Relative: 36 %
Lymphs Abs: 1.5 10*3/uL (ref 0.7–4.0)
MCH: 27.4 pg (ref 26.0–34.0)
MCHC: 33 g/dL (ref 30.0–36.0)
MCV: 83.1 fL (ref 80.0–100.0)
Monocytes Absolute: 0.3 10*3/uL (ref 0.1–1.0)
Monocytes Relative: 8 %
Neutro Abs: 2.1 10*3/uL (ref 1.7–7.7)
Neutrophils Relative %: 47 %
Platelets: 308 10*3/uL (ref 150–400)
RBC: 4.19 MIL/uL (ref 3.87–5.11)
RDW: 13.3 % (ref 11.5–15.5)
WBC: 4.3 10*3/uL (ref 4.0–10.5)
nRBC: 0 % (ref 0.0–0.2)

## 2023-08-30 LAB — TECHNOLOGIST SMEAR REVIEW: Plt Morphology: ADEQUATE

## 2023-08-30 LAB — RETIC PANEL
Immature Retic Fract: 12.4 % (ref 2.3–15.9)
RBC.: 4.17 MIL/uL (ref 3.87–5.11)
Retic Count, Absolute: 75.1 10*3/uL (ref 19.0–186.0)
Retic Ct Pct: 1.8 % (ref 0.4–3.1)
Reticulocyte Hemoglobin: 31.4 pg (ref >27.9)

## 2023-08-30 LAB — TSH: TSH: 5.409 u[IU]/mL — ABNORMAL HIGH (ref 0.350–4.500)

## 2023-08-30 LAB — LACTATE DEHYDROGENASE: LDH: 109 U/L (ref 98–192)

## 2023-08-30 NOTE — Assessment & Plan Note (Signed)
 Lab Results  Component Value Date   HGB 11.5 (L) 08/30/2023   TIBC 357 08/22/2023   IRONPCTSAT 27 08/22/2023   FERRITIN 52 08/22/2023    Hemoglobin level has improved.  Normal iron  panel.

## 2023-08-30 NOTE — Assessment & Plan Note (Addendum)
 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  Labs are reviewed and discussed with patient. S/p concurrent radiation with  5-FU/mitomycin  C  She is clinically doing well.  Recommend her to continue follow up with surgery Dr. Teresa for DRE/anoscopy every 6 months.  CT scan results and MRI results were reviewed and discussed with patient.  No evidence of recurrence. Repeat CT chest abdomen w contrast and MRI pelvis wo contrast in 1 year.

## 2023-08-30 NOTE — Assessment & Plan Note (Signed)
Stable nodules on CT

## 2023-08-30 NOTE — Addendum Note (Signed)
 Addended by: BABARA CALL on: 08/30/2023 08:42 PM   Modules accepted: Orders

## 2023-08-30 NOTE — Assessment & Plan Note (Signed)
Check T4

## 2023-08-30 NOTE — Progress Notes (Signed)
 Hematology/Oncology Progress note [phone] [phone]        CHIEF COMPLAINTS/PURPOSE OF CONSULTATION:  Anal squamous carcinoma  ASSESSMENT & PLAN:   Cancer Staging  Anal squamous cell carcinoma (HCC) Staging form: Anus, AJCC V9 - Clinical stage from 01/26/2022: Stage IIIC (cT4, cN1, cM0) - Signed by Babara Call, MD on 02/06/2022   Anal squamous cell carcinoma (HCC) At least in situ squamous cell carcinoma, however her imaging findings are more consistent with at least Stage III anal squamous cell carcinoma.  Labs are reviewed and discussed with patient. S/p concurrent radiation with  5-FU/mitomycin  C  She is clinically doing well.  Recommend her to continue follow up with surgery Dr. Teresa for DRE/anoscopy every 6 months.  CT scan results and MRI results were reviewed and discussed with patient.  No evidence of recurrence. Repeat CT chest abdomen w contrast and MRI pelvis wo contrast in 1 year.  Lung nodules Stable nodules on CT  IDA (iron  deficiency anemia) Lab Results  Component Value Date   HGB 11.5 (L) 08/30/2023   TIBC 357 08/22/2023   IRONPCTSAT 27 08/22/2023   FERRITIN 52 08/22/2023    Hemoglobin level has improved.  Normal iron  panel.  Elevated TSH Check  T4   Follow up in 6 months All questions were answered. The patient knows to call the clinic with any problems, questions or concerns.  Call Babara, MD, PhD [facility] Hematology Oncology 08/30/2023     HISTORY OF PRESENTING ILLNESS:  Cheryl Goodwin 63 y.o. female presents to establish care for Anal squamous carcinoma I have reviewed her chart and materials related to her cancer extensively and collaborated history with the patient. Summary of oncologic history is as follows: Oncology History  Anal squamous cell carcinoma (HCC)  01/18/2022 Procedure   Patient self palpated rectal/anal mass.  Denies any difficulty passing bowel movements, blood in the stool.  01/18/2022,  colonoscopy showed anal mass 0 to 1 cm from the anal verge.  Likely malignant tumor in the distal rectum, biopsied.  Nonbleeding internal hemorrhoids.   01/26/2022 Initial Diagnosis   Anal squamous cell carcinoma (HCC)  -Biopsy of the distal rectum mass showed keratinizing moderately to poorly differentiated squamous cell carcinoma at least in situ.  Superficial biopsies, invasion cannot be accessed.   01/26/2022 Cancer Staging   Staging form: Anus, AJCC V9 - Clinical stage from 01/26/2022: Stage IIIC (cT4, cN1, cM0) - Signed by Babara Call, MD on 02/06/2022 Stage prefix: Initial diagnosis   01/29/2022 Imaging   CT chest abdomen w contrast  1. No evidence of metastatic disease in the abdomen. 2. No definite findings of metastatic disease in the chest. Two solid pulmonary nodules, largest 0.5 cm in the right lower lobe.Recommend attention on follow-up chest CT in 3 months. 3. Mild patchy consolidation in the posterior right upper lobe with mild to moderate patchy ground-glass opacities in the right upper,right middle and right lower lobes, most suggestive of a mild multilobar pneumonia. 4. Moderate hiatal hernia. 5. Moderate to severe L1 vertebral compression fracture, chronic appearing.   02/03/2022 Imaging   MR pelvis wo contrast 1. Anorectal mass extending from the anterior and lateral LEFT low rectum through the anal canal and involving the LEFT sphincter complex to include the intersphincteric plane with abutment and potential early involvement of external sphincter. Also with suspicion for early encroachment upon the lower rectovaginal plane with extension towards the lower vagina. 2. Signs of LEFT inguinal nodal disease. Other small irregular lymph nodes particularly on  the LEFT suspicious with a single necrotic appearing node that is highly suspicious for if not diagnostic of inguinal nodal involvement. Note that the full pelvis is not assessed on this anorectal focused evaluation. No  adenopathy seen along the pelvic sidewalls or about the mesorectum.      02/11/2022 Procedure   Medi port placed by IR   03/08/2022 -  Chemotherapy   Patient is on Treatment Plan : ANUS Mitomycin  D1,28 + 5FU D1-4, 28-31 q32d     03/09/2022 - 04/30/2022 Radiation Therapy   Concurrent radiation and chemotherapy   02/15/2023 Imaging   CT chest abdomen pelvis w contrast showed  No developing new mass lesion, fluid collection or lymph node enlargement.   Stable prominent thoracic inlet lymph node on the left side.   Stable tiny lung nodules. Recommend simple continued follow up in 6 months.   Fatty liver infiltration.   Previous anal lesion not clearly identified but this has areas not as well defined on CT scan as on MRI. Small nodular area seen in the anal region on series 2, image 144. Please correlate with clinical findings.      Patient is widowed and current not sexually active. Denies smoking or alcohol use. Denies any abdominal pain, cough, shortness of breath nausea vomiting.  Denies any blood in the stool.  07/09/22 She was seen by Dr. Teresa and anoscopy examination showed No palpable or visible evidence of any anal canal masses or disease.  10/19/22  She was seen by Dr. Teresa and anoscopy examination showed no palpable or visible evidence of any anal canal masses or disease.   INTERVAL HISTORY Cheryl Goodwin is a 63 y.o. female who has above history reviewed by me today presents for follow up visit for anal cancer and iron  deficiency anemia.  Patient follows up with Dr. Teresa. Overall she tolerates well. rectal/anal burning has resolved. Still has intermittent diarrhea Patient denies cough, chest pain, abdominal pain or rectal bleeding. SABRA     MEDICAL HISTORY:  Past Medical History:  Diagnosis Date   Depression    GERD (gastroesophageal reflux disease)    Hyperlipidemia     SURGICAL HISTORY: Past Surgical History:  Procedure Laterality Date   AUGMENTATION MAMMAPLASTY  Bilateral 2005   with lift   BACK SURGERY  09/24/2014   L1 fracture, [facility]   BREAST ENHANCEMENT SURGERY  2007   COLONOSCOPY  2013   CYST EXCISION PERINEAL N/A 09/30/2022   Procedure: excision of perianal mass;  Surgeon: Teresa Lonni HERO, MD;  Location: [facility];  Service: General;  Laterality: N/A;  46   ENDOMETRIAL ABLATION  2013   ESOPHAGOGASTRODUODENOSCOPY (EGD) WITH PROPOFOL  N/A 07/08/2016   Procedure: ESOPHAGOGASTRODUODENOSCOPY (EGD) WITH PROPOFOL ;  Surgeon: Jinny Carmine, MD;  Location[facility];  Service: Endoscopy;  Laterality: N/A;   IR IMAGING GUIDED PORT INSERTION  02/11/2022   IR RADIOLOGIST EVAL & MGMT  04/26/2022   IR RADIOLOGIST EVAL & MGMT  04/29/2022   IR RADIOLOGIST EVAL & MGMT  05/07/2022   IR REMOVAL TUN ACCESS W/ PORT W/O FL MOD SED  04/21/2022   LASIK     RECTAL EXAM UNDER ANESTHESIA N/A 09/30/2022   Procedure: ANORECTAL EXAM UNDER ANESTHESIA;  Surgeon: Teresa Lonni HERO, MD;  Location: [facility];  Service: General;  Laterality: N/A;   STOMACH SURGERY  2006   Tummy Tuck   TUBAL LIGATION      SOCIAL HISTORY: Social History   Socioeconomic History   Marital status: Widowed  Spouse name: Not on file   Number of children: 2   Years of education: Not on file   Highest education level: Not on file  Occupational History   Occupation: Unemployed  Tobacco Use   Smoking status: Never   Smokeless tobacco: Never  Substance and Sexual Activity   Alcohol use: Yes    Alcohol/week: 1.0 standard drink of alcohol    Types: 1 Cans of beer per week    Comment: 10 beers/month   Drug use: No   Sexual activity: Not on file  Other Topics Concern   Not on file  Social History Narrative   Not on file   Social Drivers of Health   Financial Resource Strain: Medium Risk (05/06/2023)   Received from [facility]   Overall Financial Resource Strain (CARDIA)    Difficulty of Paying Living Expenses: Somewhat hard  Food Insecurity: Food Insecurity  Present (05/06/2023)   Received from [facility]   Hunger Vital Sign    Within the past 12 months, you worried that your food would run out before you got the money to buy more.: Never true    Within the past 12 months, the food you bought just didn't last and you didn't have money to get more.: Sometimes true  Transportation Needs: No Transportation Needs (05/06/2023)   Received from [facility] - Transportation    In the past 12 months, has lack of transportation kept you from medical appointments or from getting medications?: No    Lack of Transportation (Non-Medical): No  Physical Activity: Not on file  Stress: Not on file  Social Connections: Not on file  Intimate Partner Violence: Not on file    FAMILY HISTORY: Family History  Problem Relation Age of Onset   Stroke Mother    Breast cancer Mother    Emphysema Father        was a smoker   COPD Sister        was a smoker   Cancer Brother     ALLERGIES:  has no known allergies.  MEDICATIONS:  Current Outpatient Medications  Medication Sig Dispense Refill   buPROPion (WELLBUTRIN XL) 300 MG 24 hr tablet Take 300 mg by mouth daily.     esomeprazole (NEXIUM) 40 MG capsule Take 40 mg by mouth 2 (two) times daily.     lovastatin (MEVACOR) 20 MG tablet Take 20 mg by mouth at bedtime.     sertraline (ZOLOFT) 100 MG tablet Take 100 mg by mouth daily.     No current facility-administered medications for this visit.    Review of Systems  Constitutional:  Negative for appetite change, chills, fatigue and fever.  HENT:   Negative for hearing loss and voice change.   Eyes:  Negative for eye problems.  Respiratory:  Negative for chest tightness and cough.   Cardiovascular:  Negative for chest pain.  Gastrointestinal:  Negative for abdominal distention, abdominal pain and blood in stool.  Endocrine: Negative for hot flashes.  Genitourinary:  Negative for difficulty urinating and frequency.    Musculoskeletal:  Negative for arthralgias.  Skin:  Negative for itching and rash.  Neurological:  Negative for extremity weakness.  Hematological:  Negative for adenopathy.  Psychiatric/Behavioral:  Negative for confusion.      PHYSICAL EXAMINATION: ECOG PERFORMANCE STATUS: 0 - Asymptomatic  Vitals:   08/30/23 1107  BP: 117/79  Pulse: 79  Resp: 18  Temp: (!) 96.4 F (35.8  C)  SpO2: 100%   Filed Weights   08/30/23 1107  Weight: 160 lb 4.8 oz (72.7 kg)    Physical Exam Constitutional:      General: She is not in acute distress.    Appearance: She is not diaphoretic.  HENT:     Head: Normocephalic and atraumatic.   Eyes:     General: No scleral icterus.   Cardiovascular:     Rate and Rhythm: Normal rate.  Pulmonary:     Effort: Pulmonary effort is normal. No respiratory distress.     Breath sounds: Normal breath sounds. No wheezing.  Abdominal:     General: There is no distension.     Palpations: Abdomen is soft.     Tenderness: There is no abdominal tenderness.   Musculoskeletal:        General: Normal range of motion.     Cervical back: Normal range of motion and neck supple.   Skin:    Findings: No erythema.   Neurological:     Mental Status: She is alert and oriented to person, place, and time. Mental status is at baseline.     Motor: No abnormal muscle tone.   Psychiatric:        Mood and Affect: Mood and affect normal.      LABORATORY DATA:  I have reviewed the data as listed    Latest Ref Rng & Units 08/30/2023   11:38 AM 08/22/2023    8:35 AM 02/15/2023   12:26 PM  CBC  WBC 4.0 - 10.5 K/uL 4.3  4.2  4.4   Hemoglobin 12.0 - 15.0 g/dL 88.4  89.2  88.8   Hematocrit 36.0 - 46.0 % 34.8  32.2  32.8   Platelets 150 - 400 K/uL 308  290  283       Latest Ref Rng & Units 08/22/2023    8:34 AM 02/15/2023   12:26 PM 09/20/2022    1:13 PM  CMP  Glucose 70 - 99 mg/dL 895  74  90   BUN 8 - 23 mg/dL 13  14  7    Creatinine 0.44 - 1.00 mg/dL 9.16   9.14  9.25   Sodium 135 - 145 mmol/L 136  134  137   Potassium 3.5 - 5.1 mmol/L 4.0  4.1  4.1   Chloride 98 - 111 mmol/L 105  102  104   CO2 22 - 32 mmol/L 21  24  22    Calcium 8.9 - 10.3 mg/dL 8.9  9.2  9.2   Total Protein 6.5 - 8.1 g/dL 7.3  7.1    Total Bilirubin 0.0 - 1.2 mg/dL 0.6  0.3    Alkaline Phos 38 - 126 U/L 41  35    AST 15 - 41 U/L 24  19    ALT 0 - 44 U/L 18  16       RADIOGRAPHIC STUDIES: I have personally reviewed the radiological images as listed and agreed with the findings in the report. MR Pelvis Wo Contrast Result Date: 08/28/2023 CLINICAL DATA:  History of anal rectal cancer. Status post treatment. Diagnosed in 2023 EXAM: MRI PELVIS WITHOUT AND WITH CONTRAST TECHNIQUE: Multiplanar multisequence MR imaging of the pelvis was performed both before and after administration of intravenous contrast. Ultrasound gel was administered per rectum to optimize tumor evaluation. COMPARISON:  CT of same date dictated separately. Most recent MRI 02/02/2022. FINDINGS: TUMOR LOCATION No residual tumor identified. There is thin linear area of decreased ADC signal  involving the left side of the low rectum and anus including on 25 and 26 of series 5. Presumably treatment related. Small foci of retained stool. TUMOR DESCRIPTION Not applicable. T - CATEGORY Extension through Muscularis Propria: No=T2 Shortest Distance of any tumor/node from Mesorectal fascia: Not applicable. Extramural Vascular Invasion/Tumor Thrombus: No Invasion of Anterior Peritoneal Reflection: N Involvement of Adjacent Organs or Pelvic Sidewall: No Levator Ani Involvement: No N - CATEGORY Mesorectal Lymph Nodes >=14mm: None Extra-mesorectal Lymphadenopathy: No. Left inguinal adenopathy has resolved Other: Trace right pelvic fluid is unchanged on 10/03. Right ovarian/adnexal residual cyst or follicle measures 2.3 cm on 08/03 versus 2.9 cm on the prior. Hysterectomy. IMPRESSION: No residual anal rectal primary identified. Rectal  adenocarcinoma N stage: N 0 Electronically Signed   By: Rockey Kilts M.D.   On: 08/28/2023 16:03   CT CHEST ABDOMEN PELVIS W CONTRAST Result Date: 08/22/2023 CLINICAL DATA:  Rectal cancer, restaging.  * Tracking Code: BO * EXAM: CT CHEST, ABDOMEN, AND PELVIS WITH CONTRAST TECHNIQUE: Multidetector CT imaging of the chest, abdomen and pelvis was performed following the standard protocol during bolus administration of intravenous contrast. RADIATION DOSE REDUCTION: This exam was performed according to the departmental dose-optimization program which includes automated exposure control, adjustment of the mA and/or kV according to patient size and/or use of iterative reconstruction technique. CONTRAST:  OMNIPAQUE  IOHEXOL  300 MG/ML  SOLN COMPARISON:  Multiple priors including CT February 15, 2023 FINDINGS: CT CHEST FINDINGS Cardiovascular: Aortic atherosclerosis. Normal size heart. No significant pericardial effusion/thickening. Mediastinum/Nodes: Thoracic inlet thoracic inlet/low cervical lymph node on the left measures 7 mm on image 10/2, unchanged. Pre-vascular lymph node measures 7 mm in short axis on image 18/2, unchanged. Moderate size hiatal hernia. Lungs/Pleura: Stable scattered tiny pulmonary nodules. For reference: -right lower lobe pulmonary nodule measures 4 mm on image 104/4, unchanged. -left lower lobe pulmonary nodule measures 3 mm on image 119/4, unchanged. No new suspicious pulmonary nodules or masses. Hypoventilatory change in the dependent lungs. Musculoskeletal: Bilateral breast prostheses. No aggressive lytic or blastic lesion of bone. Posterior lumbosacral fixation hardware. CT ABDOMEN PELVIS FINDINGS Hepatobiliary: Diffuse hepatic steatosis. No suspicious hepatic lesion. Gallbladder is unremarkable. No biliary ductal dilation. Pancreas: No pancreatic ductal dilation or evidence of acute inflammation. Spleen: No splenomegaly. Adrenals/Urinary Tract: No suspicious adrenal nodule/mass. No  hydronephrosis. Kidneys demonstrate symmetric enhancement. Mild wall thickening of a nondistended urinary bladder. Stomach/Bowel: Radiopaque enteric contrast material traverses the splenic flexure. Moderate size hiatal hernia. No pathologic dilation of small or large bowel. Colonic stool burden compatible with constipation. No discrete suspicious anal nodule/mass identified. Please refer to same day MRI pelvis for complete description of findings in this area. Vascular/Lymphatic: No significant vascular findings are present. No enlarged abdominal or pelvic lymph nodes. Reproductive: Stable 2 cm right adnexal cyst. Other: No significant abdominopelvic free fluid. Musculoskeletal: No aggressive lytic or blastic lesion of bone. Posterior lumbosacral fixation hardware. Multilevel degenerative changes spine. Possible implant in the left gluteal soft tissues. IMPRESSION: 1. No convincing evidence of new or progressive disease within the chest, abdomen or pelvis. 2. Stable tiny bilateral pulmonary nodules. 3. Stable thoracic inlet/pre-vascular lymph nodes. 4. No discrete suspicious anal nodule/mass identified. Please refer to same day MRI pelvis for complete description of findings in this area. 5. Diffuse hepatic steatosis. 6. Moderate size hiatal hernia. 7. Colonic stool burden compatible with constipation. 8. Mild wall thickening of a nondistended urinary bladder. 9. Aortic Atherosclerosis (ICD10-I70.0). Electronically Signed   By: Reyes Raymondo HERO.D.  On: 08/22/2023 14:15

## 2023-08-30 NOTE — Progress Notes (Signed)
 Survivorship Care Plan visit completed.  Treatment summary reviewed and given to patient.  ASCO answers booklet reviewed and given to patient.  CARE program and Cancer Transitions discussed with patient along with other resources cancer center offers to patients and caregivers.  Patient verbalized understanding.

## 2023-08-31 ENCOUNTER — Encounter: Payer: Self-pay | Admitting: Oncology

## 2023-08-31 ENCOUNTER — Other Ambulatory Visit: Payer: Self-pay

## 2023-08-31 ENCOUNTER — Other Ambulatory Visit: Payer: Self-pay | Admitting: Lab

## 2023-08-31 DIAGNOSIS — R7989 Other specified abnormal findings of blood chemistry: Secondary | ICD-10-CM

## 2023-08-31 LAB — COPPER, SERUM: Copper: 106 ug/dL (ref 80–158)

## 2023-08-31 LAB — KAPPA/LAMBDA LIGHT CHAINS
Kappa free light chain: 21.8 mg/L — ABNORMAL HIGH (ref 3.3–19.4)
Kappa, lambda light chain ratio: 1.18 (ref 0.26–1.65)
Lambda free light chains: 18.5 mg/L (ref 5.7–26.3)

## 2023-08-31 LAB — HAPTOGLOBIN: Haptoglobin: 153 mg/dL (ref 37–355)

## 2023-08-31 NOTE — Telephone Encounter (Signed)
-----   Message from Zelphia Cap sent at 08/30/2023  8:40 PM EDT ----- Please add T4.  Please let patient know that repeat blood work today showed improved hemoglobin to 11.5.  Her thyroid function appears to be slightly decreased.  I added additional testing.  Results are pending. Please arrange patient to follow-up in 6 months.  Lab prior to MD. ----- Message ----- From: Rebecka, Lab In [place] Sent: 08/30/2023  11:53 AM EDT To: Zelphia Cap, MD

## 2023-08-31 NOTE — Telephone Encounter (Signed)
 [name] will you please schedule for six month follow-up with labs prior to MD visit and notify patient of date and time.

## 2023-08-31 NOTE — Telephone Encounter (Signed)
 Called patient and informed her that repeat blood work today showed improved hemoglobin to 11.5.  Also informed her that thyroid function appears to be slightly decreased and Dr. Babara has added additional testing but results are pending. I will have [name] arrange patient to follow-up in 6 months with Lab prior to MD.

## 2023-09-01 ENCOUNTER — Other Ambulatory Visit: Payer: Self-pay

## 2023-09-01 LAB — HUMAN PARVOVIRUS DNA DETECTION BY PCR: Parvovirus B19, PCR: NEGATIVE

## 2023-09-01 LAB — MULTIPLE MYELOMA PANEL, SERUM
Albumin SerPl Elph-Mcnc: 4.2 g/dL (ref 2.9–4.4)
Albumin/Glob SerPl: 1.3 (ref 0.7–1.7)
Alpha 1: 0.2 g/dL (ref 0.0–0.4)
Alpha2 Glob SerPl Elph-Mcnc: 0.7 g/dL (ref 0.4–1.0)
B-Globulin SerPl Elph-Mcnc: 1.2 g/dL (ref 0.7–1.3)
Gamma Glob SerPl Elph-Mcnc: 1.2 g/dL (ref 0.4–1.8)
Globulin, Total: 3.3 g/dL (ref 2.2–3.9)
IgA: 162 mg/dL (ref 87–352)
IgG (Immunoglobin G), Serum: 1232 mg/dL (ref 586–1602)
IgM (Immunoglobulin M), Srm: 168 mg/dL (ref 26–217)
Total Protein ELP: 7.5 g/dL (ref 6.0–8.5)

## 2023-09-02 LAB — T4: T4, Total: 6.1 ug/dL (ref 4.5–12.0)

## 2023-09-21 ENCOUNTER — Inpatient Hospital Stay

## 2023-09-21 ENCOUNTER — Inpatient Hospital Stay: Admitting: Licensed Clinical Social Worker

## 2023-12-23 ENCOUNTER — Ambulatory Visit

## 2023-12-23 ENCOUNTER — Ambulatory Visit: Payer: Self-pay | Admitting: Physician Assistant

## 2023-12-23 ENCOUNTER — Encounter: Payer: Self-pay | Admitting: Oncology

## 2023-12-23 ENCOUNTER — Encounter: Payer: Self-pay | Admitting: Emergency Medicine

## 2023-12-23 ENCOUNTER — Ambulatory Visit
Admission: EM | Admit: 2023-12-23 | Discharge: 2023-12-23 | Disposition: A | Discharge status: Home / Self Care | Attending: Physician Assistant | Admitting: Physician Assistant

## 2023-12-23 DIAGNOSIS — J208 Acute bronchitis due to other specified organisms: Secondary | ICD-10-CM

## 2023-12-23 DIAGNOSIS — R052 Subacute cough: Secondary | ICD-10-CM

## 2023-12-23 MED ORDER — PROMETHAZINE-DM 6.25-15 MG/5ML PO SYRP: 5.0000 mL | ORAL_SOLUTION | Freq: Four times a day (QID) | ORAL | 0 refills | Status: DC | PRN

## 2023-12-23 NOTE — ED Triage Notes (Signed)
 Patient c/o nonproductive cough and chest congestion for a month.  Patient denies fevers.

## 2023-12-23 NOTE — ED Provider Notes (Signed)
 [facility]    CSN: [id number] Arrival date & time: 12/23/23  1204      History   Chief Complaint Chief Complaint  Patient presents with   Cough    HPI Cheryl Goodwin is a 63 y.o. female presenting for 1 month history of dry cough and mild nasal congestion. She says she thinks she heard some wheezing today.   No fever, fatigue, sinus pain, sore throat, chest pain, or shortness of breath.   Patient says her boyfriend has also had a cough for a long time.   Patient is a non-smoker.   History significant for GERD, depression, hyperlipidemia, anal cancer, and anemia. No history of asthma or COPD.  No OTC meds taken.    HPI  Past Medical History:  Diagnosis Date   Depression    GERD (gastroesophageal reflux disease)    Hyperlipidemia     Patient Active Problem List   Diagnosis Date Noted   Anemia 08/30/2023   Elevated TSH 08/30/2023   Diarrhea 04/05/2022   Perianal irritation 04/05/2022   Neutropenia 03/22/2022   Postoperative pain 03/08/2022   Lung nodules 02/06/2022   Anal squamous cell carcinoma (HCC) 01/26/2022   Goals of care, counseling/discussion 01/26/2022   Abdominal pain, epigastric    Depression 09/29/2014   Constipation 09/27/2014   Gastroesophageal reflux disease 09/27/2014   Hyperlipidemia 09/27/2014   Closed fracture of lumbar vertebra (HCC) 09/18/2014   Fall 09/17/2014   Anxiety 04/19/2014   Herpes labialis 04/19/2014   IDA (iron  deficiency anemia) 10/07/2011    Past Surgical History:  Procedure Laterality Date   AUGMENTATION MAMMAPLASTY Bilateral 2005   with lift   BACK SURGERY  09/24/2014   L1 fracture, [facility]   BREAST ENHANCEMENT SURGERY  2007   COLONOSCOPY  2013   CYST EXCISION PERINEAL N/A 09/30/2022   Procedure: excision of perianal mass;  Surgeon: Teresa Lonni HERO, MD;  Location: [facility];  Service: General;  Laterality: N/A;  46   ENDOMETRIAL ABLATION  2013   ESOPHAGOGASTRODUODENOSCOPY (EGD) WITH PROPOFOL  N/A  07/08/2016   Procedure: ESOPHAGOGASTRODUODENOSCOPY (EGD) WITH PROPOFOL ;  Surgeon: Jinny Carmine, MD;  Location: [facility];  Service: Endoscopy;  Laterality: N/A;   IR IMAGING GUIDED PORT INSERTION  02/11/2022   IR RADIOLOGIST EVAL & MGMT  04/26/2022   IR RADIOLOGIST EVAL & MGMT  04/29/2022   IR RADIOLOGIST EVAL & MGMT  05/07/2022   IR REMOVAL TUN ACCESS W/ PORT W/O FL MOD SED  04/21/2022   LASIK     RECTAL EXAM UNDER ANESTHESIA N/A 09/30/2022   Procedure: ANORECTAL EXAM UNDER ANESTHESIA;  Surgeon: Teresa Lonni HERO, MD;  Location: [facility];  Service: General;  Laterality: N/A;   STOMACH SURGERY  2006   Tummy Tuck   TUBAL LIGATION      OB History   No obstetric history on file.      Home Medications    Prior to Admission medications   Medication Sig Start Date End Date Taking? Authorizing Provider  promethazine-dextromethorphan (PROMETHAZINE-DM) 6.25-15 MG/5ML syrup Take 5 mLs by mouth 4 (four) times daily as needed. 12/23/23  Yes Arvis Jolan NOVAK, PA-C  buPROPion (WELLBUTRIN XL) 300 MG 24 hr tablet Take 300 mg by mouth daily. 11/26/22   [provider]  esomeprazole (NEXIUM) 40 MG capsule Take 40 mg by mouth 2 (two) times daily. 11/26/22   [provider]  lovastatin (MEVACOR) 20 MG tablet Take 20 mg by mouth at bedtime.    [provider]  sertraline (ZOLOFT) 100 MG tablet Take 100 mg by mouth daily. 01/11/23   [provider]    Family History Family History  Problem Relation Age of Onset   Stroke Mother    Breast cancer Mother    Emphysema Father        was a smoker   COPD Sister        was a smoker   Cancer Brother     Social History Social History   Tobacco Use   Smoking status: Never   Smokeless tobacco: Never  Vaping Use   Vaping status: Never Used  Substance Use Topics   Alcohol use: Yes    Alcohol/week: 1.0 standard drink of alcohol    Types: 1 Cans of beer per week    Comment: 10 beers/month   Drug use: No      Allergies   Patient has no known allergies.   Review of Systems Review of Systems  Constitutional:  Negative for chills, diaphoresis, fatigue and fever.  HENT:  Positive for congestion and rhinorrhea. Negative for ear pain, sinus pressure, sinus pain and sore throat.   Respiratory:  Positive for cough and wheezing. Negative for shortness of breath.   Cardiovascular:  Negative for chest pain.  Gastrointestinal:  Negative for abdominal pain, nausea and vomiting.  Musculoskeletal:  Negative for arthralgias and myalgias.  Skin:  Negative for rash.  Neurological:  Negative for weakness and headaches.  Hematological:  Negative for adenopathy.     Physical Exam Triage Vital Signs ED Triage Vitals  Encounter Vitals Group     BP 12/23/23 1216 118/79     Girls Systolic BP Percentile --      Girls Diastolic BP Percentile --      Boys Systolic BP Percentile --      Boys Diastolic BP Percentile --      Pulse Rate 12/23/23 1216 70     Resp 12/23/23 1216 14     Temp 12/23/23 1216 98 F (36.7 C)     Temp Source 12/23/23 1216 Oral     SpO2 12/23/23 1216 96 %     Weight 12/23/23 1214 160 lb 4.4 oz (72.7 kg)     Height 12/23/23 1214 5' 3 (1.6 m)     Head Circumference --      Peak Flow --      Pain Score 12/23/23 1213 6     Pain Loc --      Pain Education --      Exclude from Growth Chart --    No data found.  Updated Vital Signs BP 118/79 (BP Location: Right Arm)   Pulse 70   Temp 98 F (36.7 C) (Oral)   Resp 14   Ht 5' 3 (1.6 m)   Wt 160 lb 4.4 oz (72.7 kg)   LMP 09/26/2011   SpO2 96%   BMI 28.39 kg/m     Physical Exam Vitals and nursing note reviewed.  Constitutional:      General: She is not in acute distress.    Appearance: Normal appearance. She is not ill-appearing or toxic-appearing.  HENT:     Head: Normocephalic and atraumatic.     Nose: Congestion (mild) present.     Mouth/Throat:     Mouth: Mucous membranes are moist.     Pharynx: Oropharynx is  clear.  Eyes:     General: No scleral icterus.       Right eye: No discharge.  Left eye: No discharge.     Conjunctiva/sclera: Conjunctivae normal.  Cardiovascular:     Rate and Rhythm: Normal rate and regular rhythm.     Heart sounds: Normal heart sounds.  Pulmonary:     Effort: Pulmonary effort is normal. No respiratory distress.     Breath sounds: Normal breath sounds. No wheezing, rhonchi or rales.  Musculoskeletal:     Cervical back: Neck supple.  Skin:    General: Skin is dry.  Neurological:     General: No focal deficit present.     Mental Status: She is alert. Mental status is at baseline.     Motor: No weakness.     Gait: Gait normal.  Psychiatric:        Mood and Affect: Mood normal.        Behavior: Behavior normal.      UC Treatments / Results  Labs (all labs ordered are listed, but only abnormal results are displayed) Labs Reviewed - No data to display  EKG   Radiology DG Chest 2 View Result Date: 12/23/2023 EXAM: 2 VIEW(S) XRAY OF THE CHEST 12/23/2023 12:35:00 PM COMPARISON: 10/07/11. CLINICAL HISTORY: Cough x 1 month. Smoker. Patient c/o nonproductive cough and chest congestion for a month. Patient denies fevers. FINDINGS: LUNGS AND PLEURA: No focal pulmonary opacity. No pulmonary edema. No pleural effusion. No pneumothorax. HEART AND MEDIASTINUM: Moderate hiatal hernia. No acute abnormality of the cardiac silhouette. BONES AND SOFT TISSUES: Posterior spinal fusion hardware noted. No acute osseous abnormality. IMPRESSION: 1. No acute cardiopulmonary abnormality identified. 2. Moderate hiatal hernia. Electronically signed by: Waddell Calk MD 12/23/2023 01:02 PM EDT RP Workstation: [id number]    Procedures Procedures (including critical care time)  Medications Ordered in UC Medications - No data to display  Initial Impression / Assessment and Plan / UC Course  I have reviewed the triage vital signs and the nursing notes.  Pertinent labs & imaging  results that were available during my care of the patient were reviewed by me and considered in my medical decision making (see chart for details).   63 y/o female presents for dry cough and nasal congestion x 1 month.   Vitals are all stable and normal. She is overall well appearing. NAD. Slight nasal congestion. Chest clear. Heart RRR.   Chest x-ray obtained. Wet read negative. Advised patient I will call her if radiology over read differs significantly from my own.  Acute viral bronchitis. Supportive care encouraged. Sent promethazine DM as needed for cough. Reviewed that symptoms from bronchitis can last up to 8 weeks. Advised returning if sinus pain, chest pain, fever or shortness of breath.   X-ray over-read is negative for pneumonia or acute abnormality. Patient informed of hiatal hernia.    Final Clinical Impressions(s) / UC Diagnoses   Final diagnoses:  Subacute cough  Acute viral bronchitis     Discharge Instructions      Chest x-ray wet read normal. I will call if radiology overread differs.  Acute viral bronchitis. Supportive care encouraged. Sent promethazine DM as needed for cough. Reviewed that symptoms from bronchitis can last up to 8 weeks. Advised returning if sinus pain, chest pain, fever or shortness of breath.      ED Prescriptions     Medication Sig Dispense Auth. Provider   promethazine-dextromethorphan (PROMETHAZINE-DM) 6.25-15 MG/5ML syrup Take 5 mLs by mouth 4 (four) times daily as needed. 118 mL Arvis Jolan NOVAK, PA-C      PDMP not reviewed this encounter.  Arvis Jolan NOVAK, PA-C 12/23/23 1308

## 2023-12-23 NOTE — Discharge Instructions (Addendum)
 Chest x-ray wet read normal. I will call if radiology overread differs.  Acute viral bronchitis. Supportive care encouraged. Sent promethazine DM as needed for cough. Reviewed that symptoms from bronchitis can last up to 8 weeks. Advised returning if sinus pain, chest pain, fever or shortness of breath.

## 2024-01-11 ENCOUNTER — Ambulatory Visit
Admission: EM | Admit: 2024-01-11 | Discharge: 2024-01-11 | Disposition: A | Discharge status: Home / Self Care | Attending: Family Medicine | Admitting: Family Medicine

## 2024-01-11 DIAGNOSIS — Z20818 Contact with and (suspected) exposure to other bacterial communicable diseases: Secondary | ICD-10-CM | POA: Diagnosis not present

## 2024-01-11 DIAGNOSIS — J020 Streptococcal pharyngitis: Secondary | ICD-10-CM | POA: Diagnosis not present

## 2024-01-11 DIAGNOSIS — J22 Unspecified acute lower respiratory infection: Secondary | ICD-10-CM

## 2024-01-11 LAB — POCT RAPID STREP A (OFFICE): Rapid Strep A Screen: POSITIVE — AB

## 2024-01-11 MED ORDER — AMOXICILLIN 500 MG PO CAPS: 1000.0000 mg | ORAL_CAPSULE | Freq: Every day | ORAL | 0 refills | Status: AC

## 2024-01-11 MED ORDER — PREDNISONE 10 MG (21) PO TBPK: ORAL_TABLET | Freq: Every day | ORAL | 0 refills | Status: AC

## 2024-01-11 NOTE — ED Provider Notes (Signed)
 [facility]    CSN: [id number] Arrival date & time: 01/11/24  0846      History   Chief Complaint Chief Complaint  Patient presents with   Sore Throat    HPI Cheryl Goodwin is a 63 y.o. female.   HPI  History obtained from the patient. Cheryl Goodwin presents for sore throat and body aches for the past 2 days. Took Advil for body aches and took some this morning prior to arrival. . Feels like she has a thickness in my throat.  Her grandson has strep and she was a round him 3 days ago.  No fever, vomiting, diarrhea, rash or belly pain.   Has been having a cough for a few weeks now.  Cough is productive of clear sputum.   No history of asthma. Denies vaping and smoking.      Past Medical History:  Diagnosis Date   Depression    GERD (gastroesophageal reflux disease)    Hyperlipidemia     Patient Active Problem List   Diagnosis Date Noted   Anemia 08/30/2023   Elevated TSH 08/30/2023   Diarrhea 04/05/2022   Perianal irritation 04/05/2022   Neutropenia 03/22/2022   Postoperative pain 03/08/2022   Lung nodules 02/06/2022   Anal squamous cell carcinoma (HCC) 01/26/2022   Goals of care, counseling/discussion 01/26/2022   Abdominal pain, epigastric    Depression 09/29/2014   Constipation 09/27/2014   Gastroesophageal reflux disease 09/27/2014   Hyperlipidemia 09/27/2014   Closed fracture of lumbar vertebra (HCC) 09/18/2014   Fall 09/17/2014   Anxiety 04/19/2014   Herpes labialis 04/19/2014   IDA (iron  deficiency anemia) 10/07/2011    Past Surgical History:  Procedure Laterality Date   AUGMENTATION MAMMAPLASTY Bilateral 2005   with lift   BACK SURGERY  09/24/2014   L1 fracture, [facility]   BREAST ENHANCEMENT SURGERY  2007   COLONOSCOPY  2013   CYST EXCISION PERINEAL N/A 09/30/2022   Procedure: excision of perianal mass;  Surgeon: Teresa Lonni HERO, MD;  Location: [facility];  Service: General;  Laterality: N/A;  46   ENDOMETRIAL ABLATION  2013    ESOPHAGOGASTRODUODENOSCOPY (EGD) WITH PROPOFOL  N/A 07/08/2016   Procedure: ESOPHAGOGASTRODUODENOSCOPY (EGD) WITH PROPOFOL ;  Surgeon: Jinny Carmine, MD;  Location: [facility];  Service: Endoscopy;  Laterality: N/A;   IR IMAGING GUIDED PORT INSERTION  02/11/2022   IR RADIOLOGIST EVAL & MGMT  04/26/2022   IR RADIOLOGIST EVAL & MGMT  04/29/2022   IR RADIOLOGIST EVAL & MGMT  05/07/2022   IR REMOVAL TUN ACCESS W/ PORT W/O FL MOD SED  04/21/2022   LASIK     RECTAL EXAM UNDER ANESTHESIA N/A 09/30/2022   Procedure: ANORECTAL EXAM UNDER ANESTHESIA;  Surgeon: Teresa Lonni HERO, MD;  Location: [facility];  Service: General;  Laterality: N/A;   STOMACH SURGERY  2006   Tummy Tuck   TUBAL LIGATION      OB History   No obstetric history on file.      Home Medications    Prior to Admission medications   Medication Sig Start Date End Date Taking? Authorizing Provider  amoxicillin (AMOXIL) 500 MG capsule Take 2 capsules (1,000 mg total) by mouth daily for 10 days. 01/11/24 01/21/24 Yes Yuleni Burich, DO  buPROPion (WELLBUTRIN XL) 300 MG 24 hr tablet Take 300 mg by mouth daily. 11/26/22  Yes [provider]  lovastatin (MEVACOR) 20 MG tablet Take 20 mg by mouth at bedtime.   Yes [provider]  predniSONE  [facility]  UNI-PAK 21 TAB) 10 MG (21) TBPK tablet Take by mouth daily. Take 6 tabs by mouth daily for 1, then 5 tabs for 1 day, then 4 tabs for 1 day, then 3 tabs for 1 day, then 2 tabs for 1 day, then 1 tab for 1 day. 01/11/24  Yes Tonique Mendonca, DO  sertraline (ZOLOFT) 100 MG tablet Take 100 mg by mouth daily. 01/11/23  Yes [provider]  esomeprazole (NEXIUM) 40 MG capsule Take 40 mg by mouth 2 (two) times daily. 11/26/22   [provider]    Family History Family History  Problem Relation Age of Onset   Stroke Mother    Breast cancer Mother    Emphysema Father        was a smoker   COPD Sister        was a smoker   Cancer Brother      Social History Social History   Tobacco Use   Smoking status: Never   Smokeless tobacco: Never  Vaping Use   Vaping status: Never Used  Substance Use Topics   Alcohol use: Yes    Alcohol/week: 1.0 standard drink of alcohol    Types: 1 Cans of beer per week    Comment: 10 beers/month   Drug use: No     Allergies   Patient has no known allergies.   Review of Systems Review of Systems: negative unless otherwise stated in HPI.      Physical Exam Triage Vital Signs ED Triage Vitals  Encounter Vitals Group     BP      Girls Systolic BP Percentile      Girls Diastolic BP Percentile      Boys Systolic BP Percentile      Boys Diastolic BP Percentile      Pulse      Resp      Temp      Temp src      SpO2      Weight      Height      Head Circumference      Peak Flow      Pain Score      Pain Loc      Pain Education      Exclude from Growth Chart    No data found.  Updated Vital Signs BP 124/80 (BP Location: Left Arm)   Pulse 93   Temp 98.6 F (37 C) (Oral)   Resp 20   Wt 71.2 kg   LMP 09/26/2011   SpO2 94%   BMI 27.81 kg/m   Visual Acuity Right Eye Distance:   Left Eye Distance:   Bilateral Distance:    Right Eye Near:   Left Eye Near:    Bilateral Near:     Physical Exam GEN:     alert, non-toxic appearing female in no distress    HENT:  mucus membranes moist, oropharyngeal without lesions, moderate erythema, + tonsillar hypertrophy but no exudates, no nasal discharge EYES:   pupils equal and reactive, no scleral injection or discharge NECK:  normal ROM, +lymphadenopathy, no meningismus   RESP:  no increased work of breathing, rhonchi bilaterally, no wheezing or rales  CVS:   regular rate and rhythm Skin:   warm and dry    UC Treatments / Results  Labs (all labs ordered are listed, but only abnormal results are displayed) Labs Reviewed  POCT RAPID STREP A (OFFICE) - Abnormal; Notable for the following components:  Result Value    Rapid Strep A Screen Positive (*)    All other components within normal limits    EKG   Radiology No results found.   Procedures Procedures (including critical care time)  Medications Ordered in UC Medications - No data to display  Initial Impression / Assessment and Plan / UC Course  I have reviewed the triage vital signs and the nursing notes.  Pertinent labs & imaging results that were available during my care of the patient were reviewed by me and considered in my medical decision making (see chart for details).       Pt is a 63 y.o. female who presents for 2 days of respiratory symptoms. [place] is afebrile here with recent antipyretics. Satting well on room air. Overall pt is non-toxic appearing, well hydrated, without respiratory distress. Pulmonary exam is remarkable for bilateral rhonchi. She has  moderate erythema, + tonsillar hypertrophy but no exudates. POC strep is positive.   Treat lower respiratory tract infection and strep with amoxicillin and prednisone  as below.  Discussed symptomatic treatment.  Continue Promethazine DM for cough. Declined refill. Typical duration of symptoms discussed.   Return and ED precautions given and voiced understanding. Discussed MDM, treatment plan and plan for follow-up with patient  who agrees with plan.     Final Clinical Impressions(s) / UC Diagnoses   Final diagnoses:  Exposure to strep throat  Streptococcal sore throat  Lower respiratory tract infection     Discharge Instructions      Your strep test is positive. Stop by the pharmacy to pick up your prescriptions.  Follow up with your primary care provider or return to the urgent care, if not improving.       ED Prescriptions     Medication Sig Dispense Auth. Provider   predniSONE  (STERAPRED UNI-PAK 21 TAB) 10 MG (21) TBPK tablet Take by mouth daily. Take 6 tabs by mouth daily for 1, then 5 tabs for 1 day, then 4 tabs for 1 day, then 3 tabs for 1 day, then 2  tabs for 1 day, then 1 tab for 1 day. 21 tablet Denys Labree, DO   amoxicillin (AMOXIL) 500 MG capsule Take 2 capsules (1,000 mg total) by mouth daily for 10 days. 20 capsule Tore Carreker, DO      PDMP not reviewed this encounter.   Kriste Berth, DO 01/11/24 1025

## 2024-01-11 NOTE — Discharge Instructions (Addendum)
 Your strep test is positive. Stop by the pharmacy to pick up your prescriptions.  Follow up with your primary care provider or return to the urgent care, if not improving.

## 2024-01-11 NOTE — ED Triage Notes (Addendum)
 Patient states that grandson visited this past weekend and has strep. Patient states that her throat is sore(thick feeling) bodyaches and headache. Patient taking advil. Sx x 2 days Patient still having sx from last visit with productive cough.

## 2024-03-05 ENCOUNTER — Inpatient Hospital Stay

## 2024-03-08 ENCOUNTER — Ambulatory Visit: Admitting: Oncology
